# Patient Record
Sex: Female | Born: 1995 | Race: Black or African American | Hispanic: No | Marital: Single | Smoking: Never smoker
Health system: Southern US, Community
[De-identification: ages and names within clinical notes are randomized; demographics above are authoritative.]

## PROBLEM LIST (undated history)

## (undated) DIAGNOSIS — Z9109 Other allergy status, other than to drugs and biological substances: Secondary | ICD-10-CM

## (undated) DIAGNOSIS — Z8489 Family history of other specified conditions: Secondary | ICD-10-CM

## (undated) DIAGNOSIS — F329 Major depressive disorder, single episode, unspecified: Secondary | ICD-10-CM

## (undated) DIAGNOSIS — F419 Anxiety disorder, unspecified: Secondary | ICD-10-CM

## (undated) DIAGNOSIS — L709 Acne, unspecified: Secondary | ICD-10-CM

## (undated) DIAGNOSIS — M1990 Unspecified osteoarthritis, unspecified site: Secondary | ICD-10-CM

## (undated) DIAGNOSIS — T8859XA Other complications of anesthesia, initial encounter: Secondary | ICD-10-CM

## (undated) DIAGNOSIS — F32A Depression, unspecified: Secondary | ICD-10-CM

## (undated) DIAGNOSIS — Q501 Developmental ovarian cyst: Secondary | ICD-10-CM

## (undated) DIAGNOSIS — I10 Essential (primary) hypertension: Secondary | ICD-10-CM

## (undated) DIAGNOSIS — T4145XA Adverse effect of unspecified anesthetic, initial encounter: Secondary | ICD-10-CM

## (undated) HISTORY — DX: Anxiety disorder, unspecified: F41.9

## (undated) HISTORY — PX: NO PAST SURGERIES: SHX2092

## (undated) HISTORY — DX: Depression, unspecified: F32.A

## (undated) HISTORY — PX: UMBILICAL HERNIA REPAIR: SHX196

## (undated) HISTORY — DX: Major depressive disorder, single episode, unspecified: F32.9

---
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
## 2014-08-18 ENCOUNTER — Inpatient Hospital Stay (HOSPITAL_COMMUNITY)
Admission: RE | Admit: 2014-08-18 | Discharge: 2014-08-23 | DRG: 885 | Disposition: A | Discharge status: Home / Self Care | Payer: Federal, State, Local not specified - PPO | Attending: Psychiatry | Admitting: Psychiatry

## 2014-08-18 ENCOUNTER — Encounter (HOSPITAL_COMMUNITY): Payer: Self-pay | Admitting: *Deleted

## 2014-08-18 DIAGNOSIS — D709 Neutropenia, unspecified: Secondary | ICD-10-CM | POA: Clinically undetermined

## 2014-08-18 DIAGNOSIS — F332 Major depressive disorder, recurrent severe without psychotic features: Secondary | ICD-10-CM | POA: Diagnosis present

## 2014-08-18 DIAGNOSIS — F329 Major depressive disorder, single episode, unspecified: Secondary | ICD-10-CM | POA: Diagnosis present

## 2014-08-18 DIAGNOSIS — R05 Cough: Secondary | ICD-10-CM | POA: Diagnosis not present

## 2014-08-18 DIAGNOSIS — F419 Anxiety disorder, unspecified: Secondary | ICD-10-CM | POA: Diagnosis present

## 2014-08-18 DIAGNOSIS — R059 Cough, unspecified: Secondary | ICD-10-CM

## 2014-08-18 DIAGNOSIS — D72819 Decreased white blood cell count, unspecified: Secondary | ICD-10-CM | POA: Clinically undetermined

## 2014-08-18 LAB — CBC
HEMATOCRIT: 39 % (ref 36.0–46.0)
Hemoglobin: 13.1 g/dL (ref 12.0–15.0)
MCH: 29.3 pg (ref 26.0–34.0)
MCHC: 33.6 g/dL (ref 30.0–36.0)
MCV: 87.2 fL (ref 78.0–100.0)
Platelets: 208 10*3/uL (ref 150–400)
RBC: 4.47 MIL/uL (ref 3.87–5.11)
RDW: 12 % (ref 11.5–15.5)
WBC: 1.9 10*3/uL — AB (ref 4.0–10.5)

## 2014-08-18 LAB — COMPREHENSIVE METABOLIC PANEL
ALK PHOS: 77 U/L (ref 38–126)
ALT: 12 U/L — ABNORMAL LOW (ref 14–54)
AST: 22 U/L (ref 15–41)
Albumin: 3.7 g/dL (ref 3.5–5.0)
Anion gap: 6 (ref 5–15)
BUN: 8 mg/dL (ref 6–20)
CALCIUM: 8.5 mg/dL — AB (ref 8.9–10.3)
CO2: 21 mmol/L — ABNORMAL LOW (ref 22–32)
Chloride: 109 mmol/L (ref 101–111)
Creatinine, Ser: 0.97 mg/dL (ref 0.44–1.00)
GFR calc Af Amer: >60 mL/min (ref >60)
GFR calc non Af Amer: >60 mL/min (ref >60)
Glucose, Bld: 99 mg/dL (ref 70–99)
POTASSIUM: 4.1 mmol/L (ref 3.5–5.1)
Sodium: 136 mmol/L (ref 135–145)
TOTAL PROTEIN: 6.7 g/dL (ref 6.5–8.1)
Total Bilirubin: 0.4 mg/dL (ref 0.3–1.2)

## 2014-08-18 LAB — URINALYSIS, ROUTINE W REFLEX MICROSCOPIC
Bilirubin Urine: NEGATIVE
Glucose, UA: NEGATIVE mg/dL
HGB URINE DIPSTICK: NEGATIVE
Ketones, ur: NEGATIVE mg/dL
Leukocytes, UA: NEGATIVE
NITRITE: NEGATIVE
PROTEIN: NEGATIVE mg/dL
SPECIFIC GRAVITY, URINE: 1.03 (ref 1.005–1.030)
UROBILINOGEN UA: 0.2 mg/dL (ref 0.0–1.0)
pH: 5.5 (ref 5.0–8.0)

## 2014-08-18 LAB — RAPID URINE DRUG SCREEN, HOSP PERFORMED
Amphetamines: NOT DETECTED
BARBITURATES: NOT DETECTED
Benzodiazepines: NOT DETECTED
COCAINE: NOT DETECTED
Opiates: NOT DETECTED
TETRAHYDROCANNABINOL: NOT DETECTED

## 2014-08-18 LAB — HEPATIC FUNCTION PANEL
ALK PHOS: 80 U/L (ref 38–126)
ALT: 12 U/L — AB (ref 14–54)
AST: 20 U/L (ref 15–41)
Albumin: 3.7 g/dL (ref 3.5–5.0)
BILIRUBIN TOTAL: 0.4 mg/dL (ref 0.3–1.2)
Bilirubin, Direct: <0.1 mg/dL — ABNORMAL LOW (ref 0.1–0.5)
TOTAL PROTEIN: 6.7 g/dL (ref 6.5–8.1)

## 2014-08-18 LAB — PREGNANCY, URINE: PREG TEST UR: NEGATIVE

## 2014-08-18 LAB — TSH: TSH: 0.131 u[IU]/mL — AB (ref 0.350–4.500)

## 2014-08-18 LAB — ETHANOL: Alcohol, Ethyl (B): <5 mg/dL (ref <5)

## 2014-08-18 MED ORDER — GUAIFENESIN ER 600 MG PO TB12: 600.0000 mg | ORAL_TABLET | Freq: Two times a day (BID) | ORAL | Status: DC | PRN

## 2014-08-18 MED ORDER — MAGNESIUM HYDROXIDE 400 MG/5ML PO SUSP: 30.0000 mL | Freq: Every day | ORAL | Status: DC | PRN

## 2014-08-18 MED ORDER — AZITHROMYCIN 250 MG PO TABS
250.0000 mg | ORAL_TABLET | Freq: Every day | ORAL | Status: AC
  Administered 2014-08-19 – 2014-08-22 (×4): 250 mg via ORAL
  Filled 2014-08-18 (×6): qty 1

## 2014-08-18 MED ORDER — ALUM & MAG HYDROXIDE-SIMETH 200-200-20 MG/5ML PO SUSP: 30.0000 mL | ORAL | Status: DC | PRN

## 2014-08-18 MED ORDER — ACETAMINOPHEN 325 MG PO TABS: 650.0000 mg | ORAL_TABLET | Freq: Four times a day (QID) | ORAL | Status: DC | PRN

## 2014-08-18 MED ORDER — ALBUTEROL SULFATE (2.5 MG/3ML) 0.083% IN NEBU
2.5000 mg | INHALATION_SOLUTION | RESPIRATORY_TRACT | Status: DC
  Filled 2014-08-18 (×5): qty 3

## 2014-08-18 MED ORDER — EPINEPHRINE 0.3 MG/0.3ML IJ SOAJ: 0.3000 mg | INTRAMUSCULAR | Status: DC | PRN

## 2014-08-18 MED ORDER — ALBUTEROL SULFATE HFA 108 (90 BASE) MCG/ACT IN AERS
2.0000 | INHALATION_SPRAY | RESPIRATORY_TRACT | Status: DC
  Administered 2014-08-18 – 2014-08-22 (×23): 2 via RESPIRATORY_TRACT
  Filled 2014-08-18 (×2): qty 6.7

## 2014-08-18 MED ORDER — TRAZODONE HCL 50 MG PO TABS
50.0000 mg | ORAL_TABLET | Freq: Every evening | ORAL | Status: DC | PRN
  Filled 2014-08-18: qty 4

## 2014-08-18 MED ORDER — AZITHROMYCIN 500 MG PO TABS
500.0000 mg | ORAL_TABLET | Freq: Every day | ORAL | Status: AC
  Administered 2014-08-18: 500 mg via ORAL
  Filled 2014-08-18: qty 2
  Filled 2014-08-18: qty 1

## 2014-08-18 MED ORDER — IBUPROFEN 600 MG PO TABS
600.0000 mg | ORAL_TABLET | Freq: Four times a day (QID) | ORAL | Status: DC | PRN
  Administered 2014-08-18 – 2014-08-19 (×2): 600 mg via ORAL
  Filled 2014-08-18 (×2): qty 1

## 2014-08-18 NOTE — Progress Notes (Signed)
Introduced self to pt. Asked to provide with urine sample and provided ibuprofen for fever. LCSW with pt., so will revisit.

## 2014-08-18 NOTE — Progress Notes (Signed)
Patient was seen by this NP.  Per nursing patient was afebrile 102.  She c/o muscle aches and dry cough.  Lung sounds were clear.  She states that she started to be symptomatic this past weekend.  Ibuprofen was ordered and recheck of temp 99.1 Also ordered Mucinex.   Will continue to monitor.

## 2014-08-18 NOTE — Tx Team (Signed)
Initial Interdisciplinary Treatment Plan   PATIENT STRESSORS: Educational concerns Financial difficulties Health problems   PATIENT STRENGTHS: Ability for insight Active sense of humor Average or above average intelligence Capable of independent living Communication skills Financial means   PROBLEM LIST: Problem List/Patient Goals Date to be addressed Date deferred Reason deferred Estimated date of resolution  Depression 08/18/14     Suicidal Ideation 08/18/14                                                DISCHARGE CRITERIA:  Ability to meet basic life and health needs Adequate post-discharge living arrangements Improved stabilization in mood, thinking, and/or behavior Medical problems require only outpatient monitoring  PRELIMINARY DISCHARGE PLAN: Participate in family therapy Placement in alternative living arrangements Return to previous living arrangement Return to previous work or school arrangements  PATIENT/FAMIILY INVOLVEMENT: This treatment plan has been presented to and reviewed with the patient, Kathleen [name], and/or family member.  The patient and family have been given the opportunity to ask questions and make suggestions.  Lauralyn Primes 08/18/2014, 3:20 PM
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
## 2014-08-18 NOTE — BH Assessment (Addendum)
Tele Assessment Note   Kathleen [name] is an 19 y.o. female. Pt arrived voluntarily with her college counselor Kathleen [name] to [facility]. Pt reports SI/HI with no plan. Pt denied AVH. Pt reports 1 previous SI attempt at the age of 19 y.o. According to the Pt, she thinks about harming her family but she does not have a plan to harm them. Pt states that she does not have a identified family member whom she wants to harm. Pt reports the following stressors family conflict, finances, lack of friends, no appetite, decreased sleep, and tearfulness. Pt states that she has been depressed for a year. According to the Pt, she does not know what triggered her depression. Pt states that she does not know the triggers to her SI. Pt states that she was previously seen by a therapist but she cannot recall the doctor nor the practice. Pt was prescribed Zoloft last year but she is currently off of her medication. Pt reports that depressive symptoms worsened when she did not have her Zoloft. Pt also reports side-effects from Zoloft. Medication prescribed by Dr. Nancy [name] psychiatrist at [facility]. Pt reports poor appetite and decreased.  Pt states that she does not have a support system and she could not contract for safety.  Writer consulted with Kathleen Grates, NP. Per Kathleen [name] Pt meets inpatient criteria and is appropriate for [facility]. Pt admitted to [facility].   Axis I: Major Depression, Recurrent severe Axis II: Deferred Axis III:  Past Medical History  Diagnosis Date  . Acne   . Environmental allergies    Axis IV: educational problems, problems related to social environment and problems with primary support group Axis V: 31-40 impairment in reality testing  Past Medical History:  Past Medical History  Diagnosis Date  . Acne   . Environmental allergies     No past surgical history on file.  Family History: No family history on file.  Social History:  reports that she has never smoked. She has never used  smokeless tobacco. She reports that she does not drink alcohol or use illicit drugs.  Additional Social History:  Alcohol / Drug Use Pain Medications: Pt denies Prescriptions: Pt denies Over the Counter: Pt denies History of alcohol / drug use?: No history of alcohol / drug abuse Longest period of sobriety (when/how long): NA  CIWA:   COWS:    PATIENT STRENGTHS: (choose at least two) Average or above average intelligence Communication skills  Allergies:  Allergies  Allergen Reactions  . Fish Allergy     hives  . Shellfish Allergy     Home Medications:  (Not in a hospital admission)  OB/GYN Status:  No LMP recorded.  General Assessment Data Location of Assessment: [facility] Is this a Tele or Face-to-Face Assessment?: Tele Assessment Is this an Initial Assessment or a Re-assessment for this encounter?: Initial Assessment Marital status: Single Maiden name: Peeks Is patient pregnant?: No Pregnancy Status: No Living Arrangements: Parent, Other relatives Can pt return to current living arrangement?: Yes Admission Status: Voluntary Is patient capable of signing voluntary admission?: Yes Referral Source: Self/Family/Friend Insurance type: [place] Living Arrangements: Parent, Other relatives Name of Psychiatrist: NA Name of Therapist: NA  Education Status Is patient currently in school?: Yes Current Grade: College Highest grade of school patient has completed: 12 Name of school: [employer] person: [place] to self with the past 6 months Suicidal Ideation: Yes-Currently Present Has patient been a  risk to self within the past 6 months prior to admission? : Yes Suicidal Intent: Yes-Currently Present Has patient had any suicidal intent within the past 6 months prior to admission? : Yes Is patient at risk for suicide?: Yes Suicidal Plan?: No Has patient had any suicidal plan within the past 6 months prior  to admission? : No Access to Means: No What has been your use of drugs/alcohol within the last 12 months?: NA Previous Attempts/Gestures: Yes How many times?: 1 Other Self Harm Risks: NA Triggers for Past Attempts: Family contact Intentional Self Injurious Behavior: None Family Suicide History: No Recent stressful life event(s): Conflict (Comment) (conflict with family) Persecutory voices/beliefs?: No Depression: Yes Depression Symptoms: Tearfulness, Isolating, Fatigue, Loss of interest in usual pleasures, Feeling worthless/self pity, Feeling angry/irritable Substance abuse history and/or treatment for substance abuse?: No Suicide prevention information given to non-admitted patients: Not applicable  Risk to Others within the past 6 months Homicidal Ideation: Yes-Currently Present Does patient have any lifetime risk of violence toward others beyond the six months prior to admission? : No Thoughts of Harm to Others: Yes-Currently Present Comment - Thoughts of Harm to Others: thoughts of harm to family Current Homicidal Intent: No Current Homicidal Plan: No Access to Homicidal Means: No Identified Victim: NA History of harm to others?: No Assessment of Violence: None Noted Violent Behavior Description: NA Does patient have access to weapons?: No Criminal Charges Pending?: No Does patient have a court date: No Is patient on probation?: No  Psychosis Hallucinations: None noted Delusions: None noted  Mental Status Report Appearance/Hygiene: Disheveled Eye Contact: Fair Motor Activity: Freedom of movement Speech: Logical/coherent Level of Consciousness: Alert Mood: Depressed, Sad Affect: Depressed, Sad Anxiety Level: Moderate Thought Processes: Coherent, Relevant Judgement: Unimpaired Orientation: Person, Place, Time, Situation, Appropriate for developmental age Obsessive Compulsive Thoughts/Behaviors: None  Cognitive Functioning Concentration: Normal Memory: Recent  Intact, Remote Intact IQ: Average Insight: Fair Impulse Control: Fair Appetite: Poor Weight Loss: 0 Weight Gain: 0 Sleep: Decreased Total Hours of Sleep: 3 Vegetative Symptoms: None  ADLScreening [facility] Assessment Services) Patient's cognitive ability adequate to safely complete daily activities?: Yes Patient able to express need for assistance with ADLs?: No Independently performs ADLs?: Yes (appropriate for developmental age)  Prior Inpatient Therapy Prior Inpatient Therapy: Yes Prior Therapy Dates: 2013 Prior Therapy Facilty/Provider(s): [facility] Reason for Treatment: Depression  Prior Outpatient Therapy Prior Outpatient Therapy: Yes Prior Therapy Dates: 2016 Prior Therapy Facilty/Provider(s): [facility] Reason for Treatment: Depression Does patient have an ACCT team?: No Does patient have Intensive In-House Services?  : No Does patient have [facility] services? : No Does patient have P4CC services?: No  ADL Screening (condition at time of admission) Patient's cognitive ability adequate to safely complete daily activities?: Yes Is the patient deaf or have difficulty hearing?: No Does the patient have difficulty seeing, even when wearing glasses/contacts?: No Does the patient have difficulty concentrating, remembering, or making decisions?: No Patient able to express need for assistance with ADLs?: No Does the patient have difficulty dressing or bathing?: No Independently performs ADLs?: Yes (appropriate for developmental age) Does the patient have difficulty walking or climbing stairs?: No Weakness of Legs: None Weakness of Arms/Hands: None       Abuse/Neglect Assessment (Assessment to be complete while patient is alone) Physical Abuse: Denies Verbal Abuse: Denies Sexual Abuse: Denies Exploitation of patient/patient's resources: Denies Self-Neglect: Denies Values / Beliefs Cultural Requests During Hospitalization: None Spiritual Requests During Hospitalization:  None   Advance Directives (For Healthcare) Does patient  have an advance directive?: No Would patient like information on creating an advanced directive?: No - patient declined information    Additional Information 1:1 In Past 12 Months?: No CIRT Risk: No Elopement Risk: No Does patient have medical clearance?: Yes     Disposition:  Disposition Initial Assessment Completed for this Encounter: Yes Disposition of Patient: Inpatient treatment program Type of inpatient treatment program: Adult  Harvy Riera D 08/18/2014 1:31 PM

## 2014-08-19 ENCOUNTER — Encounter (HOSPITAL_COMMUNITY): Payer: Self-pay | Admitting: Psychiatry

## 2014-08-19 ENCOUNTER — Inpatient Hospital Stay (HOSPITAL_COMMUNITY)
Admission: RE | Admit: 2014-08-19 | Discharge: 2014-08-19 | Disposition: A | Discharge status: Home / Self Care | Payer: Federal, State, Local not specified - PPO | Source: Home / Self Care | Attending: Internal Medicine | Admitting: Internal Medicine

## 2014-08-19 DIAGNOSIS — R05 Cough: Secondary | ICD-10-CM

## 2014-08-19 DIAGNOSIS — R059 Cough, unspecified: Secondary | ICD-10-CM | POA: Diagnosis present

## 2014-08-19 LAB — INFLUENZA PANEL BY PCR (TYPE A & B)
H1N1 flu by pcr: NOT DETECTED
Influenza A By PCR: NEGATIVE
Influenza B By PCR: NEGATIVE

## 2014-08-19 LAB — LIPID PANEL
CHOL/HDL RATIO: 2.2 ratio
CHOLESTEROL: 107 mg/dL (ref 0–200)
HDL: 49 mg/dL (ref >40)
LDL CALC: 45 mg/dL (ref 0–99)
Triglycerides: 65 mg/dL (ref <150)
VLDL: 13 mg/dL (ref 0–40)

## 2014-08-19 MED ORDER — SERTRALINE HCL 50 MG PO TABS
50.0000 mg | ORAL_TABLET | Freq: Every day | ORAL | Status: DC
  Administered 2014-08-19 – 2014-08-23 (×5): 50 mg via ORAL
  Filled 2014-08-19: qty 1
  Filled 2014-08-19: qty 4
  Filled 2014-08-19 (×6): qty 1

## 2014-08-19 NOTE — Progress Notes (Signed)
D:Pt returned from cafeteria early with reports of nausea. Pt reports that she had problems with nausea taking Zoloft in the past. A:Escorted pt to her room. Gave [place] for nausea and a emesis bag. R:Safety maintained on the unit.

## 2014-08-19 NOTE — H&P (Addendum)
Psychiatric Admission Assessment Adult  Patient Identification: Kathleen [name] MRN:  [id number] Date of Evaluation:  08/19/2014 Chief Complaint:  Depression Principal Diagnosis: <principal problem not specified> Diagnosis:   Patient Active Problem List   Diagnosis Date Noted  . MDD (major depressive disorder) [F32.2] 08/18/2014  . Depression, major, single episode, moderate [F32.1] 10/01/2011  . Anxiety disorder [F41.9] 09/30/2011  . Parent-child relational problem [Z62.820] 09/30/2011   History of Present Illness::  Patient is 19 years old. She is single, lives with parents and is enrolled in college. She reports depression, sadness. States she has been struggling with depression for several weeks to months, and tends to ruminate about worsening academic performance, grades, which in turn she also attributes to depression, stating that it is hard for her to feel interest or energy to study as much as she needs to. She states she has family stressors, and states that her mother in particular " does not believe me that I am not feeling well". Reports that parents expect her to excel/ do well. Of note, over recent days patient has developed some type of  Acute infection and describes dry cough, headache, myalgias ,  feeling weak, and fever.  States she did not want to go to college because she was feeling sick, but " mother took my temperature and I did not have a fever, so she told me I had to go". Patient went to a counselor on campus and reported some suicidal ideations, without specific plan, resulting in her being brought to hospital. As per chart notes, patient had reported some vague thoughts of harming her parents- at this time denies any violent or homicidal ideations.    Elements:   Worsening depression, in the context of academic and family stressors and prior history of depressive illness .  Associated Signs/Symptoms: Depression Symptoms:  Describes anhedonia, low energy level,  poor sleep, disrupted appetite, decreased sense of self esteem. (Hypo) Manic Symptoms: does not endorse  Anxiety Symptoms:  Describes some free floating anxiety, and also describes symptoms suggestive of social anxiety, phobia, such as increased anxiety in group situations or having to present in front of a group. Psychotic Symptoms:  Does not endorse nor does she present internally preoccupied  PTSD Symptoms: Does not endorse  Total Time spent with patient: 45 minutes   Psychiatric History- patient describes a history of depression and endorses a history of self cutting although not since age 17. One prior admission to adolescent unit in the past for depression and suicidal gesture.. Denies history of psychosis,mania, or PTSD. Was not recently taking any psychiatric medications. In the past was on Zoloft, but took it only for a month or two- does not remember having had any side effects./ As noted describes symptoms of Social Phobia, and describes self as shy and tending to be introverted.  Past Medical History: Acute infectious illness - see above . Past Medical History  Diagnosis Date  . Acne   . Environmental allergies    History reviewed. No pertinent past surgical history. Family History:  Patient lives with her parents and has 4 siblings- history of mental illness in extended family members but cannot specify. Father has history of Alcohol Abuse  Social History:  Single, lives with parents, enrolled in college, no SO at this time, no children, denies legal issues, identifies dropping grades as a major stressor. History  Alcohol Use No     History  Drug Use No    History   Social History  .  Marital Status: Single    Spouse Name: N/A  . Number of Children: N/A  . Years of Education: N/A   Occupational History  . Student     repeat 11th grade at [employer] Topics  . Smoking status: Never Smoker   . Smokeless tobacco: Never Used  . Alcohol Use:  No  . Drug Use: No  . Sexual Activity: No   Other Topics Concern  . None   Social History Narrative   Additional Social History:    Pain Medications: None Prescriptions: None Over the Counter: V/A History of alcohol / drug use?: No history of alcohol / drug abuse Longest period of sobriety (when/how long): N/A Negative Consequences of Use: Work / Youth worker Withdrawal Symptoms: Other (Comment) (N/A)   Musculoskeletal: Strength & Muscle Tone: within normal limits Gait & Station: normal Patient leans: N/A  Psychiatric Specialty Exam: Physical Exam  Review of Systems  Constitutional: Positive for fever, weight loss and malaise/fatigue.       But today not febrile   HENT: Positive for sore throat.   Eyes: Negative.   Respiratory: Positive for cough. Negative for hemoptysis, sputum production, shortness of breath and wheezing.   Cardiovascular: Negative.   Gastrointestinal: Negative.  Negative for vomiting and diarrhea.  Genitourinary: Negative.  Negative for dysuria, urgency and frequency.  Musculoskeletal: Positive for myalgias and back pain.  Skin: Negative.  Negative for rash.  Neurological: Positive for headaches. Negative for seizures.  Endo/Heme/Allergies: Negative.   Psychiatric/Behavioral: Positive for depression and suicidal ideas.    Blood pressure 105/81, pulse 118, temperature 98.9 F (37.2 C), temperature source Oral, resp. rate 18, height 5' 5.5" (1.664 m), weight 121 lb (54.885 kg), SpO2 97 %.Body mass index is 19.82 kg/(m^2).  General Appearance: Fairly Groomed  Engineer, water::  Good  Speech:  Normal Rate  Volume:  Decreased  Mood:  Depressed  Affect:  Constricted  Thought Process:  Goal Directed and Linear  Orientation:  Full (Time, Place, and Person)- no current confusion or delirium  Thought Content:  denies hallucinations and is not internally preoccupied, no delusions expressed   Suicidal Thoughts:  No- at this time denies any plan or intention of  hurting herself and contracts for safety on the unit   Homicidal Thoughts:  No  Memory:  recent and remote grossly intact   Judgement:  Fair  Insight:  Fair  Psychomotor Activity:  Decreased  Concentration:  Good  Recall:  Good  Fund of Knowledge:Good  Language: Good  Akathisia:  Negative  Handed:  Right  AIMS (if indicated):     Assets:  Communication Skills Desire for Improvement Resilience Social Support  ADL's:  Impaired  Cognition: WNL  Sleep:      Risk to Self: Suicidal Ideation: Yes-Currently Present Suicidal Intent: Yes-Currently Present Is patient at risk for suicide?: Yes Suicidal Plan?: No Access to Means: No What has been your use of drugs/alcohol within the last 12 months?: Patient denies How many times?: 1 Other Self Harm Risks: NA Triggers for Past Attempts: Family contact Intentional Self Injurious Behavior: None Risk to Others: Homicidal Ideation: Yes-Currently Present Thoughts of Harm to Others: Yes-Currently Present Comment - Thoughts of Harm to Others: thoughts of harm to family Current Homicidal Intent: No Current Homicidal Plan: No Access to Homicidal Means: No Identified Victim: NA History of harm to others?: No Assessment of Violence: None Noted Violent Behavior Description: NA Does patient have access to weapons?: No Criminal Charges Pending?:  No Does patient have a court date: No Prior Inpatient Therapy: Prior Inpatient Therapy: Yes Prior Therapy Dates: 2013 Prior Therapy Facilty/Provider(s): [facility] Reason for Treatment: Depression Prior Outpatient Therapy: Prior Outpatient Therapy: Yes Prior Therapy Dates: 2016 Prior Therapy Facilty/Provider(s): [facility] Reason for Treatment: Depression Does patient have an ACCT team?: No Does patient have Intensive In-House Services?  : No Does patient have [facility] services? : No Does patient have P4CC services?: No  Alcohol Screening: 1. How often do you have a drink containing alcohol?:  Never 9. Have you or someone else been injured as a result of your drinking?: No 10. Has a relative or friend or a doctor or another health worker been concerned about your drinking or suggested you cut down?: No Alcohol Use Disorder Identification Test Final Score (AUDIT): 0 Brief Intervention: AUDIT score less than 7 or less-screening does not suggest unhealthy drinking-brief intervention not indicated  Allergies:   Allergies  Allergen Reactions  . Shellfish Allergy Anaphylaxis and Hives  . Fish Allergy Hives   Lab Results:  Results for orders placed or performed during the hospital encounter of 08/18/14 (from the past 48 hour(s))  Urinalysis, Routine w reflex microscopic     Status: None   Collection Time: 08/18/14  3:21 PM  Result Value Ref Range   Color, Urine YELLOW YELLOW   APPearance CLEAR CLEAR   Specific Gravity, Urine 1.030 1.005 - 1.030   pH 5.5 5.0 - 8.0   Glucose, UA NEGATIVE NEGATIVE mg/dL   Hgb urine dipstick NEGATIVE NEGATIVE   Bilirubin Urine NEGATIVE NEGATIVE   Ketones, ur NEGATIVE NEGATIVE mg/dL   Protein, ur NEGATIVE NEGATIVE mg/dL   Urobilinogen, UA 0.2 0.0 - 1.0 mg/dL   Nitrite NEGATIVE NEGATIVE   Leukocytes, UA NEGATIVE NEGATIVE    Comment: MICROSCOPIC NOT DONE ON URINES WITH NEGATIVE PROTEIN, BLOOD, LEUKOCYTES, NITRITE, OR GLUCOSE <1000 mg/dL. Performed at [facility]   Pregnancy, urine     Status: None   Collection Time: 08/18/14  3:21 PM  Result Value Ref Range   Preg Test, Ur NEGATIVE NEGATIVE    Comment:        THE SENSITIVITY OF THIS METHODOLOGY IS >20 mIU/mL. Performed at [facility]   Urine rapid drug screen (hosp performed)     Status: None   Collection Time: 08/18/14  3:21 PM  Result Value Ref Range   Opiates NONE DETECTED NONE DETECTED   Cocaine NONE DETECTED NONE DETECTED   Benzodiazepines NONE DETECTED NONE DETECTED   Amphetamines NONE DETECTED NONE DETECTED   Tetrahydrocannabinol NONE DETECTED  NONE DETECTED   Barbiturates NONE DETECTED NONE DETECTED    Comment:        DRUG SCREEN FOR MEDICAL PURPOSES ONLY.  IF CONFIRMATION IS NEEDED FOR ANY PURPOSE, NOTIFY LAB WITHIN 5 DAYS.        LOWEST DETECTABLE LIMITS FOR URINE DRUG SCREEN Drug Class       Cutoff (ng/mL) Amphetamine      1000 Barbiturate      200 Benzodiazepine   027 Tricyclics       253 Opiates          300 Cocaine          300 THC              50 Performed at [facility]   Influenza panel by PCR (type A & B, H1N1)     Status: None   Collection Time: 08/18/14  6:52 PM  Result Value Ref Range   Influenza A By PCR NEGATIVE NEGATIVE   Influenza B By PCR NEGATIVE NEGATIVE   H1N1 flu by pcr NOT DETECTED NOT DETECTED    Comment:        The Xpert Flu assay (FDA approved for nasal aspirates or washes and nasopharyngeal swab specimens), is intended as an aid in the diagnosis of influenza and should not be used as a sole basis for treatment. Performed at [facility]   Hepatic function panel     Status: Abnormal   Collection Time: 08/18/14  7:30 PM  Result Value Ref Range   Total Protein 6.7 6.5 - 8.1 g/dL   Albumin 3.7 3.5 - 5.0 g/dL   AST 20 15 - 41 U/L   ALT 12 (L) 14 - 54 U/L   Alkaline Phosphatase 80 38 - 126 U/L   Total Bilirubin 0.4 0.3 - 1.2 mg/dL   Bilirubin, Direct <0.1 (L) 0.1 - 0.5 mg/dL   Indirect Bilirubin NOT CALCULATED 0.3 - 0.9 mg/dL    Comment: Performed at [facility]  CBC     Status: Abnormal   Collection Time: 08/18/14  7:33 PM  Result Value Ref Range   WBC 1.9 (L) 4.0 - 10.5 K/uL   RBC 4.47 3.87 - 5.11 MIL/uL   Hemoglobin 13.1 12.0 - 15.0 g/dL   HCT 39.0 36.0 - 46.0 %   MCV 87.2 78.0 - 100.0 fL   MCH 29.3 26.0 - 34.0 pg   MCHC 33.6 30.0 - 36.0 g/dL   RDW 12.0 11.5 - 15.5 %   Platelets 208 150 - 400 K/uL    Comment: Performed at [facility]  Comprehensive metabolic panel     Status: Abnormal   Collection Time:  08/18/14  7:33 PM  Result Value Ref Range   Sodium 136 135 - 145 mmol/L   Potassium 4.1 3.5 - 5.1 mmol/L   Chloride 109 101 - 111 mmol/L   CO2 21 (L) 22 - 32 mmol/L   Glucose, Bld 99 70 - 99 mg/dL   BUN 8 6 - 20 mg/dL   Creatinine, Ser 0.97 0.44 - 1.00 mg/dL   Calcium 8.5 (L) 8.9 - 10.3 mg/dL   Total Protein 6.7 6.5 - 8.1 g/dL   Albumin 3.7 3.5 - 5.0 g/dL   AST 22 15 - 41 U/L   ALT 12 (L) 14 - 54 U/L   Alkaline Phosphatase 77 38 - 126 U/L   Total Bilirubin 0.4 0.3 - 1.2 mg/dL   GFR calc non Af Amer >60 >60 mL/min   GFR calc Af Amer >60 >60 mL/min    Comment: (NOTE) The eGFR has been calculated using the CKD EPI equation. This calculation has not been validated in all clinical situations. eGFR's persistently <60 mL/min signify possible Chronic Kidney Disease.    Anion gap 6 5 - 15    Comment: Performed at [facility]  TSH     Status: Abnormal   Collection Time: 08/18/14  7:33 PM  Result Value Ref Range   TSH 0.131 (L) 0.350 - 4.500 uIU/mL    Comment: Performed at [facility]  Ethanol     Status: None   Collection Time: 08/18/14  7:33 PM  Result Value Ref Range   Alcohol, Ethyl (B) <5 <5 mg/dL    Comment:        LOWEST DETECTABLE LIMIT FOR SERUM ALCOHOL IS 11 mg/dL  FOR MEDICAL PURPOSES ONLY Performed at [facility]   Lipid panel     Status: None   Collection Time: 08/18/14  7:33 PM  Result Value Ref Range   Cholesterol 107 0 - 200 mg/dL   Triglycerides 65 <150 mg/dL   HDL 49 >40 mg/dL   Total CHOL/HDL Ratio 2.2 RATIO   VLDL 13 0 - 40 mg/dL   LDL Cholesterol 45 0 - 99 mg/dL    Comment:        Total Cholesterol/HDL:CHD Risk Coronary Heart Disease Risk Table                     Men   Women  1/2 Average Risk   3.4   3.3  Average Risk       5.0   4.4  2 X Average Risk   9.6   7.1  3 X Average Risk  23.4   11.0        Use the calculated Patient Ratio above and the CHD Risk Table to determine the patient's  CHD Risk.        ATP III CLASSIFICATION (LDL):  <100     mg/dL   Optimal  100-129  mg/dL   Near or Above                    Optimal  130-159  mg/dL   Borderline  160-189  mg/dL   High  >190     mg/dL   Very High Performed at [facility]    Current Medications: Current Facility-Administered Medications  Medication Dose Route Frequency Provider Last Rate Last Dose  . acetaminophen (TYLENOL) tablet 650 mg  650 mg Oral Q6H PRN [name] B Rankin, NP      . albuterol (PROVENTIL HFA;VENTOLIN HFA) 108 (90 BASE) MCG/ACT inhaler 2 puff  2 puff Inhalation Q4H Kerrie Buffalo, NP   2 puff at 08/19/14 1230  . alum & mag hydroxide-simeth (MAALOX/MYLANTA) 200-200-20 MG/5ML suspension 30 mL  30 mL Oral Q4H PRN [name] B Rankin, NP      . azithromycin (ZITHROMAX) tablet 250 mg  250 mg Oral Daily Kerrie Buffalo, NP   250 mg at 08/19/14 0820  . EPINEPHrine (EPI-PEN) injection 0.3 mg  0.3 mg Intramuscular PRN [name] B Rankin, NP      . guaiFENesin (MUCINEX) 12 hr tablet 600 mg  600 mg Oral BID PRN Kerrie Buffalo, NP      . ibuprofen (ADVIL,MOTRIN) tablet 600 mg  600 mg Oral Q6H PRN Kerrie Buffalo, NP   600 mg at 08/19/14 0827  . magnesium hydroxide (MILK OF MAGNESIA) suspension 30 mL  30 mL Oral Daily PRN [name] B Rankin, NP      . traZODone (DESYREL) tablet 50 mg  50 mg Oral QHS PRN [name] B Rankin, NP       PTA Medications: Prescriptions prior to admission  Medication Sig Dispense Refill Last Dose  . diphenhydrAMINE (BENADRYL) 12.5 MG/5ML liquid Take 50 mg by mouth every 6 (six) hours as needed for allergies.   Past Week  . EPINEPHrine 0.3 mg/0.3 mL IJ SOAJ injection Inject 0.3 mg into the muscle once as needed (For anaphylaxis.).   never used  . ibuprofen (ADVIL,MOTRIN) 200 MG tablet Take 200 mg by mouth every 6 (six) hours as needed for fever.   08/17/2014  . loratadine (CLARITIN) 10 MG tablet Take 10 mg by mouth daily as needed for allergies.   08/18/2014  Previous Psychotropic Medications:  As  above, states she was on Zoloft for a brief period of time ( 1-2 months ) in the past. Does not remember having had side effects. Not taking any psychiatric medications recently.  Substance Abuse History in the last 12 months:  No.- patient denies any alcohol or drug abuse     Consequences of Substance Abuse: Negative  Results for orders placed or performed during the hospital encounter of 08/18/14 (from the past 72 hour(s))  Urinalysis, Routine w reflex microscopic     Status: None   Collection Time: 08/18/14  3:21 PM  Result Value Ref Range   Color, Urine YELLOW YELLOW   APPearance CLEAR CLEAR   Specific Gravity, Urine 1.030 1.005 - 1.030   pH 5.5 5.0 - 8.0   Glucose, UA NEGATIVE NEGATIVE mg/dL   Hgb urine dipstick NEGATIVE NEGATIVE   Bilirubin Urine NEGATIVE NEGATIVE   Ketones, ur NEGATIVE NEGATIVE mg/dL   Protein, ur NEGATIVE NEGATIVE mg/dL   Urobilinogen, UA 0.2 0.0 - 1.0 mg/dL   Nitrite NEGATIVE NEGATIVE   Leukocytes, UA NEGATIVE NEGATIVE    Comment: MICROSCOPIC NOT DONE ON URINES WITH NEGATIVE PROTEIN, BLOOD, LEUKOCYTES, NITRITE, OR GLUCOSE <1000 mg/dL. Performed at [facility]   Pregnancy, urine     Status: None   Collection Time: 08/18/14  3:21 PM  Result Value Ref Range   Preg Test, Ur NEGATIVE NEGATIVE    Comment:        THE SENSITIVITY OF THIS METHODOLOGY IS >20 mIU/mL. Performed at [facility]   Urine rapid drug screen (hosp performed)     Status: None   Collection Time: 08/18/14  3:21 PM  Result Value Ref Range   Opiates NONE DETECTED NONE DETECTED   Cocaine NONE DETECTED NONE DETECTED   Benzodiazepines NONE DETECTED NONE DETECTED   Amphetamines NONE DETECTED NONE DETECTED   Tetrahydrocannabinol NONE DETECTED NONE DETECTED   Barbiturates NONE DETECTED NONE DETECTED    Comment:        DRUG SCREEN FOR MEDICAL PURPOSES ONLY.  IF CONFIRMATION IS NEEDED FOR ANY PURPOSE, NOTIFY LAB WITHIN 5 DAYS.        LOWEST  DETECTABLE LIMITS FOR URINE DRUG SCREEN Drug Class       Cutoff (ng/mL) Amphetamine      1000 Barbiturate      200 Benzodiazepine   287 Tricyclics       867 Opiates          300 Cocaine          300 THC              50 Performed at [facility]   Influenza panel by PCR (type A & B, H1N1)     Status: None   Collection Time: 08/18/14  6:52 PM  Result Value Ref Range   Influenza A By PCR NEGATIVE NEGATIVE   Influenza B By PCR NEGATIVE NEGATIVE   H1N1 flu by pcr NOT DETECTED NOT DETECTED    Comment:        The Xpert Flu assay (FDA approved for nasal aspirates or washes and nasopharyngeal swab specimens), is intended as an aid in the diagnosis of influenza and should not be used as a sole basis for treatment. Performed at [facility]   Hepatic function panel     Status: Abnormal   Collection Time: 08/18/14  7:30 PM  Result Value Ref Range   Total Protein 6.7 6.5 -  8.1 g/dL   Albumin 3.7 3.5 - 5.0 g/dL   AST 20 15 - 41 U/L   ALT 12 (L) 14 - 54 U/L   Alkaline Phosphatase 80 38 - 126 U/L   Total Bilirubin 0.4 0.3 - 1.2 mg/dL   Bilirubin, Direct <0.1 (L) 0.1 - 0.5 mg/dL   Indirect Bilirubin NOT CALCULATED 0.3 - 0.9 mg/dL    Comment: Performed at [facility]  CBC     Status: Abnormal   Collection Time: 08/18/14  7:33 PM  Result Value Ref Range   WBC 1.9 (L) 4.0 - 10.5 K/uL   RBC 4.47 3.87 - 5.11 MIL/uL   Hemoglobin 13.1 12.0 - 15.0 g/dL   HCT 39.0 36.0 - 46.0 %   MCV 87.2 78.0 - 100.0 fL   MCH 29.3 26.0 - 34.0 pg   MCHC 33.6 30.0 - 36.0 g/dL   RDW 12.0 11.5 - 15.5 %   Platelets 208 150 - 400 K/uL    Comment: Performed at [facility]  Comprehensive metabolic panel     Status: Abnormal   Collection Time: 08/18/14  7:33 PM  Result Value Ref Range   Sodium 136 135 - 145 mmol/L   Potassium 4.1 3.5 - 5.1 mmol/L   Chloride 109 101 - 111 mmol/L   CO2 21 (L) 22 - 32 mmol/L   Glucose, Bld 99 70 - 99 mg/dL   BUN 8  6 - 20 mg/dL   Creatinine, Ser 0.97 0.44 - 1.00 mg/dL   Calcium 8.5 (L) 8.9 - 10.3 mg/dL   Total Protein 6.7 6.5 - 8.1 g/dL   Albumin 3.7 3.5 - 5.0 g/dL   AST 22 15 - 41 U/L   ALT 12 (L) 14 - 54 U/L   Alkaline Phosphatase 77 38 - 126 U/L   Total Bilirubin 0.4 0.3 - 1.2 mg/dL   GFR calc non Af Amer >60 >60 mL/min   GFR calc Af Amer >60 >60 mL/min    Comment: (NOTE) The eGFR has been calculated using the CKD EPI equation. This calculation has not been validated in all clinical situations. eGFR's persistently <60 mL/min signify possible Chronic Kidney Disease.    Anion gap 6 5 - 15    Comment: Performed at [facility]  TSH     Status: Abnormal   Collection Time: 08/18/14  7:33 PM  Result Value Ref Range   TSH 0.131 (L) 0.350 - 4.500 uIU/mL    Comment: Performed at [facility]  Ethanol     Status: None   Collection Time: 08/18/14  7:33 PM  Result Value Ref Range   Alcohol, Ethyl (B) <5 <5 mg/dL    Comment:        LOWEST DETECTABLE LIMIT FOR SERUM ALCOHOL IS 11 mg/dL FOR MEDICAL PURPOSES ONLY Performed at [facility]   Lipid panel     Status: None   Collection Time: 08/18/14  7:33 PM  Result Value Ref Range   Cholesterol 107 0 - 200 mg/dL   Triglycerides 65 <150 mg/dL   HDL 49 >40 mg/dL   Total CHOL/HDL Ratio 2.2 RATIO   VLDL 13 0 - 40 mg/dL   LDL Cholesterol 45 0 - 99 mg/dL    Comment:        Total Cholesterol/HDL:CHD Risk Coronary Heart Disease Risk Table  Men   Women  1/2 Average Risk   3.4   3.3  Average Risk       5.0   4.4  2 X Average Risk   9.6   7.1  3 X Average Risk  23.4   11.0        Use the calculated Patient Ratio above and the CHD Risk Table to determine the patient's CHD Risk.        ATP III CLASSIFICATION (LDL):  <100     mg/dL   Optimal  100-129  mg/dL   Near or Above                    Optimal  130-159  mg/dL   Borderline  160-189  mg/dL   High  >190     mg/dL   Very  High Performed at [facility]     Observation Level/Precautions:  15 minute checks  Laboratory:  As needed - will request hospitalist consult regarding report of febrile illness and suppressed WBC/ TSH - of note , influenza work up negative . Repeat CBC and obtain T3/FT4  Psychotherapy:  Milieu, supportive  Groups   Medications:   Patient agrees to Zoloft trial- we reviewed side effects to include risk of increased suicidal ideations in young adults  Start Zoloft 50 mgrs QDAY   Consultations:  As above, will request hospitalist consult   Discharge Concerns:  Family stressors   Estimated LOS: 6 days   Other:     Psychological Evaluations: No   Treatment Plan Summary: Daily contact with patient to assess and evaluate symptoms and progress in treatment, Medication management, Plan inpatient psychiatric admission and medications as above   Medical Decision Making:  Review of Psycho-Social Stressors (1), Review or order clinical lab tests (1), Established Problem, Worsening (2) and Review of New Medication or Change in Dosage (2)  I certify that inpatient services furnished can reasonably be expected to improve the patient's condition.   Santhosh Gulino 5/6/20161:57 PM

## 2014-08-19 NOTE — [facility] Suicide Risk Assessment (Addendum)
[place] INPATIENT:  Family/Significant Other Suicide Prevention Education  Suicide Prevention Education:  Contact Attempts: Alcario Drought, Mother, [phone]; has been identified by the patient as the family member/significant other with whom the patient will be residing, and identified as the person(s) who will aid the patient in the event of a mental health crisis.  With written consent from the patient, two attempts were made to provide suicide prevention education, prior to and/or following the patient's discharge.  We were unsuccessful in providing suicide prevention education.  A suicide education pamphlet was given to the patient to share with family/significant other.   Date and time of first attempt: Message left on mother's voicemail to call CSW around 3:30 PM. Date and time of second attempt:  Message left on voicemail on Monday, May 9, ,2016 at 11:10 AM  Concha Pyo 08/19/2014, 3:30 PM

## 2014-08-19 NOTE — Plan of Care (Signed)
Problem: Alteration in mood Goal: LTG-Patient reports reduction in suicidal thoughts (Patient reports reduction in suicidal thoughts and is able to verbalize a safety plan for whenever patient is feeling suicidal)  Outcome: Progressing Pt reports decrease in SI thoughts. Goal: STG-Patient reports thoughts of self-harm to staff Outcome: Progressing Pt denies self harm thoughts today.

## 2014-08-19 NOTE — Progress Notes (Signed)
D:Pt is in her bed with a headache and productive cough. Pt rates her headache pain as a 5 on 1-10 scale with 10 being the most. Pt rates depression and anxiety as a 5 on 1-10 scale with 10 being the most. A:Offered support and 15 minute checks. Gave prn ibuprofen for headache. Gave 2 cups of water. Called [facility] lab requesting lab results for the flu that was collected yesterday evening and was told it was being sent to [facility] and may have a result between 10am and 11am. Was told could call [phone] at that time. R:Pt denies si and hi. Safety maintained on the unit.

## 2014-08-19 NOTE — Consult Note (Signed)
[facility] Medical Consultation  Kathleen [name] YQI:[id number] DOB: 22-Jul-1995 DOA: 08/18/2014 PCP: No primary care provider on file.   Requesting physician: Dr. Parke Poisson  Date of consultation: 08/19/2014 Reason for consultation: fevers and malaise   Impression/Recommendations Principal Problem:   MDD (major depressive disorder) - management per primary team    Fever and leukopenia  - unclear etiology  - with leukopenia, certainly worrisome for underlying immunocompromised condition - will for CXR, HIV - repeat CBC with diff in AM - UA with no signs of UTI but was obtained on 5/5, will repeat today  - pt was started on zithromax for presumptive bronchitis, today is day #2 - continue tylenol as needed for fever    Elevated TSH - T3/T4 requested for further evaluation  I will followup again tomorrow. Please contact me if I can be of assistance in the meanwhile. Thank you for this consultation.  Chief Complaint: fever and myalgias   HPI:  Pt is very pleasant 19 yo female with known depression, admitted voluntary on 5/5 for management of depression, now with persistent fevers of up to 102.5 F. Pt reports non productive cough for several days and chest discomfort with coughing spells, this has been associated with malaise and poor oral intake. Pt denies known sick contacts or exposures, no similar events in the past. Pt also denies abd or urinary concerns, no weight loss..  Review of Systems:  Constitutional: Per HPI HENT: Negative for ear pain, nosebleeds, congestion, Eyes: Negative for pain, discharge, redness, itching and visual disturbance.  Respiratory: Negative for shortness of breath, wheezing and stridor.   Cardiovascular: Negative for chest pain, palpitations and leg swelling.  Gastrointestinal: Negative for abdominal distention.  Genitourinary: Negative for dysuria, urgency, frequency Musculoskeletal: Negative for back pain, joint swelling, arthralgias and gait  problem.  Neurological: Negative for dizziness, tremors, seizures, syncope Hematological: Negative for adenopathy. Does not bruise/bleed easily.  Psychiatric/Behavioral: Negative for hallucinations    Past Medical History  Diagnosis Date  . Acne   . Environmental allergies    History reviewed. No pertinent past surgical history. Social History:  reports that she has never smoked. She has never used smokeless tobacco. She reports that she does not drink alcohol or use illicit drugs.  Allergies  Allergen Reactions  . Shellfish Allergy Anaphylaxis and Hives  . Fish Allergy Hives   Family history of HTN   Prior to Admission medications   Medication Sig Start Date End Date Taking? Authorizing Provider  diphenhydrAMINE (BENADRYL) 12.5 MG/5ML liquid Take 50 mg by mouth every 6 (six) hours as needed for allergies.   Yes Historical Provider, MD  EPINEPHrine 0.3 mg/0.3 mL IJ SOAJ injection Inject 0.3 mg into the muscle once as needed (For anaphylaxis.).   Yes Historical Provider, MD  ibuprofen (ADVIL,MOTRIN) 200 MG tablet Take 200 mg by mouth every 6 (six) hours as needed for fever.   Yes Historical Provider, MD  loratadine (CLARITIN) 10 MG tablet Take 10 mg by mouth daily as needed for allergies.   Yes Historical Provider, MD   Physical Exam: Blood pressure 105/81, pulse 118, temperature 98.5 F (36.9 C), temperature source Oral, resp. rate 18, height 5' 5.5" (1.664 m), weight 54.885 kg (121 lb), SpO2 97 %. Filed Vitals:   08/19/14 1709  BP:   Pulse:   Temp: 98.5 F (36.9 C)  Resp:    Physical Exam  Constitutional: Appears well-developed and well-nourished. No distress.  HENT: Normocephalic. External right and left ear normal. Oropharynx is clear  and moist.  Eyes: Conjunctivae and EOM are normal. PERRLA, no scleral icterus.  Neck: Normal ROM. Neck supple. No JVD. No tracheal deviation. No thyromegaly.  CVS: RRR, S1/S2 +, no murmurs, no gallops, no carotid bruit.  Pulmonary:  Effort and breath sounds normal, no stridor, rhonchi, wheezes, rales.  Abdominal: Soft. BS +,  no distension, tenderness, rebound or guarding.  Musculoskeletal: Normal range of motion. No edema and no tenderness.  Lymphadenopathy: No lymphadenopathy noted, cervical, inguinal. Neuro: Alert. Normal reflexes, muscle tone coordination. No cranial nerve deficit. Skin: Skin is warm and dry. No rash noted. Not diaphoretic. No erythema. No pallor.  Psychiatric: Normal mood and affect. Behavior, judgment, thought content normal.   Labs on Admission:  Basic Metabolic Panel:  Recent Labs Lab 08/18/14 1933  NA 136  K 4.1  CL 109  CO2 21*  GLUCOSE 99  BUN 8  CREATININE 0.97  CALCIUM 8.5*   Liver Function Tests:  Recent Labs Lab 08/18/14 1930 08/18/14 1933  AST 20 22  ALT 12* 12*  ALKPHOS 80 77  BILITOT 0.4 0.4  PROT 6.7 6.7  ALBUMIN 3.7 3.7   CBC:  Recent Labs Lab 08/18/14 1933  WBC 1.9*  HGB 13.1  HCT 39.0  MCV 87.2  PLT 208   Radiological Exams on Admission: Pending    EKG: not done  Time spent: 50 minutes   MAGICK-Kaleisha Bhargava [facility] Pager [phone]  If 7PM-7AM, please contact night-coverage [email or web address] Password [facility] 08/19/2014, 5:43 PM

## 2014-08-19 NOTE — Progress Notes (Signed)
Patient ID: Kathleen [name], female   DOB: 06/26/1995, 19 y.o.   MRN: [id number] D: Client visible on the unit, visiting with parents "they talked to the school counselor and I will be able to catch up on my work" Client reports visit with parents was good, "but now I want to go home" Client also in dayroom doing a puzzle. Client reports depression as "3" of 10." "I didn't like being shut up in that room" A: Writer provided emotional support, encouraged client to report any concerns. Staff will monitor q28min for safety. R: Client is safe on the unit, attended group.

## 2014-08-19 NOTE — BHH Group Notes (Signed)
[place] LCSW Group Therapy  Feelings Around Relapse 1:15 -2:30        08/19/2014   Type of Therapy:  Group Therapy  Participation Level:  Appropriate  Participation Quality:  Appropriate  Affect:  Appropriate  Cognitive:  Attentive Appropriate  Insight:  Developing/Improving  Engagement in Therapy: Developing/Improving  Modes of Intervention:  Discussion Exploration Problem-Solving Supportive  Summary of Progress/Problems:  The topic for today was feelings around relapse. Patient processed feelings toward relapse and was able to relate to peers. Patient sharing relapsoing for her would be keeping things to herself and not asking for help.  Patient identified coping skills that can be used to prevent a relapse is talking with a therapist..   Concha Pyo 08/19/2014 3:26 PM

## 2014-08-19 NOTE — Progress Notes (Signed)
Patient ID: Kathleen [name], female   DOB: 1995/12/15, 19 y.o.   MRN: [id number] D: Client in bed, reports cold,shivering noted. A: Writer introduced self to client, adjusted temperature for comfort, VS taken. Medication reviewed and administered, education client on use of inhaler. Staff will monitor for safety q42min. R: Client is safe on the unit, reported warm with temperature adjustment.

## 2014-08-19 NOTE — BHH Group Notes (Signed)
[place] Group Notes:  (Nursing/MHT/Case Management/Adjunct)  Date:  08/18/14  Time:  2030  Type of Therapy:  Psychoeducational Skills  Participation Level:  Did not attend  Participation Quality:     Affect:    Cognitive:    Insight:    Engagement in Group:    Modes of Intervention:    Summary of Progress/Problems: Patient on droplet precautions and can not attend  Franciso Bend 08/19/2014, 12:08 AM

## 2014-08-19 NOTE — Progress Notes (Signed)
Pt stated that she had an ok day and is looking forward to feeling better.

## 2014-08-19 NOTE — BHH Group Notes (Signed)
[facility] LCSW Aftercare Discharge Planning Group Note   08/19/2014 11:37 AM  Participation Quality:  Patient did not attended group - isolated with flu.   Concha Pyo  08/19/2014  11:37 AM

## 2014-08-19 NOTE — Tx Team (Signed)
Interdisciplinary Treatment Plan Update (Adult)  Date:  08/19/2014  Time Reviewed:  11:39 AM   Progress in Treatment: Attending groups: Patient is new to the mileu. Participating in groups:  Patient is new to the mileu Taking medication as prescribed:  Patient is taking medications Tolerating medication:  Patient is tolerating medications Family/Significant othe contact made:   No, will asked for consent to make collateral contact Patient understands diagnosis:Yes, patient understands diagnosis and need for treatment Discussing patient identified problems/goals with staff:  Yes, patient is able to express goals/problems Medical problems stabilized or resolved:  Yes Denies suicidal/homicidal ideation: Yes, patient is denying SI/HI. Issues/concerns per patient self-inventory:   Other:   Discharge Plan or Barriers:  Patient will discharge home with outpatient follow up to be determined  Reason for Continuation of Hospitalization: Anxiety Depression Medication stabilization   Comments:    Kathleen [name] is an 19 y.o. female. Pt arrived voluntarily with her college counselor [name] to [facility]. Pt reports SI/HI with no plan. Pt denied AVH. Pt reports 1 previous SI attempt at the age of 19 y.o. According to the Pt, she thinks about harming her family but she does not have a plan to harm them. Pt states that she does not have a identified family member whom she wants to harm. Pt reports the following stressors family conflict, finances, lack of friends, no appetite, decreased sleep, and tearfulness. Pt states that she has been depressed for a year. According to the Pt, she does not know what triggered her depression. Pt states that she does not know the triggers to her SI. Pt states that she was previously seen by a therapist but she cannot recall the doctor nor the practice. Pt was prescribed Zoloft last year but she is currently off of her medication. Pt reports that depressive symptoms  worsened when she did not have her Zoloft. Pt also reports side-effects from Zoloft. Medication prescribed by Dr. [name] psychiatrist at [facility]. Pt reports poor appetite and decreased.    Additional comments:  Patient and CSW reviewed Patient Discharge Process Letter/Patient Involvement Form.  Patient verbalized understanding and signed form.  Patient and CSW also reviewed and identified patient's goals and treatment plan.  Patient verbalized understanding and agreed to plan.  Estimated length of stay: 3-5 days  New goal(s):  Stabilize flu symptoms.  Review of initial/current patient goals per problem list:  Patient depressive symptoms to be stabilized and outpatient follow up will be scheduled.   Attendees: Patient 08/19/2014 11:39 AM   Family:   08/19/2014 11:39 AM   Physician:  Neita Garnet, MD 08/19/2014 11:39 AM   Nursing:    08/19/2014 11:39 AM   Clinical Social Worker:  Joette Catching, LCSW 08/19/2014 11:39 AM   Clinical Social Worker:  Tilden Fossa, LCSW-A 08/19/2014 11:39 AM   Case Manager:  Lars Pinks, RN 08/19/2014 11:39 AM   Other:  Desma Paganini, RN 08/19/2014 11:39 AM  Other:  Keane Police, RN 08/19/2014  11:39 AM   Other:  08/19/2014 11:39 AM   Other:  08/19/2014 11:39 AM   Other:  08/19/2014 11:39 AM   Other:   08/19/2014 11:39 AM   Other:   08/19/2014 11:39 AM   Other:  08/19/2014 11:39 AM   Other:   08/19/2014 11:39 AM    Scribe for Treatment Team:   Concha Pyo, 08/19/2014   11:39 AM

## 2014-08-19 NOTE — BHH Suicide Risk Assessment (Signed)
[facility] Admission Suicide Risk Assessment   Nursing information obtained from:  Patient Demographic factors:  Low socioeconomic status, Living alone Current Mental Status:  Suicidal ideation indicated by patient Loss Factors:  Decrease in vocational status, Decline in physical health Historical Factors:  Prior suicide attempts Risk Reduction Factors:  Sense of responsibility to family Total Time spent with patient: 45 minutes Principal Problem: MDD (major depressive disorder) Diagnosis:   Patient Active Problem List   Diagnosis Date Noted  . MDD (major depressive disorder) [F32.2] 08/18/2014  . Depression, major, single episode, moderate [F32.1] 10/01/2011  . Anxiety disorder [F41.9] 09/30/2011  . Parent-child relational problem [Z62.820] 09/30/2011     Continued Clinical Symptoms:  Alcohol Use Disorder Identification Test Final Score (AUDIT): 0 The "Alcohol Use Disorders Identification Test", Guidelines for Use in Primary Care, Second Edition.  World Pharmacologist [facility] Score between 0-7:  no or low risk or alcohol related problems. Score between 8-15:  moderate risk of alcohol related problems. Score between 16-19:  high risk of alcohol related problems. Score 20 or above:  warrants further diagnostic evaluation for alcohol dependence and treatment.   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 Reports worsening depression over recent weeks to months, dropping grades/academic performance, increased isolation.  Reports some increased family stress, in particular regarding relationship with mother.Recently feeling ill, and did have fever, myalgias, dry cough upon admission ( influenza work up negative )  She  Reported depression and SI at college so was brought to ED. Has a history of having been on Zoloft briefly in the past, without side effects.     Musculoskeletal: Strength & Muscle Tone: within normal limits Gait & Station: normal Patient leans: N/A  Psychiatric Specialty Exam: Physical Exam  ROS  Blood pressure 105/81, pulse 118, temperature 98.9 F (37.2 C), temperature source Oral, resp. rate 18, height 5' 5.5" (1.664 m), weight 121 lb (54.885 kg), SpO2 97 %.Body mass index is 19.82 kg/(m^2).  SEE ADMIT NOTE MSE                                                        COGNITIVE FEATURES THAT CONTRIBUTE TO RISK:  Closed-mindedness    SUICIDE RISK:   Moderate:  Frequent suicidal ideation with limited intensity, and duration, some specificity in terms of plans, no associated intent, good self-control, limited dysphoria/symptomatology, some risk factors present, and identifiable protective factors, including available and accessible social support.  PLAN OF CARE: Patient will be admitted to inpatient psychiatric unit for stabilization and safety. Will provide and encourage milieu participation. Provide medication management and maked adjustments as needed.  Will follow daily.    Medical Decision Making:  Review of Psycho-Social Stressors (1), Review or order clinical lab tests (1), Established Problem, Worsening (2) and Review of Medication Regimen & Side Effects (2)  I certify that inpatient services furnished can reasonably be expected to improve the patient's condition.   Kathleen [name] 08/19/2014, 2:25 PM

## 2014-08-19 NOTE — Progress Notes (Signed)
Pt was taken to [facility] for chest x-ray accompanied by Probation officer via [place] [place].

## 2014-08-19 NOTE — Progress Notes (Signed)
Pt taken off of droplet precautions per Lattie Haw with Infection Control. Flu test negative. Instructed pt to wash hands frequently and when leaving and entering her room.

## 2014-08-20 DIAGNOSIS — F332 Major depressive disorder, recurrent severe without psychotic features: Secondary | ICD-10-CM

## 2014-08-20 LAB — CBC WITH DIFFERENTIAL/PLATELET
Basophils Absolute: 0 10*3/uL (ref 0.0–0.1)
Basophils Relative: 0 % (ref 0–1)
Eosinophils Absolute: 0 10*3/uL (ref 0.0–0.7)
Eosinophils Relative: 0 % (ref 0–5)
HCT: 41.5 % (ref 36.0–46.0)
Hemoglobin: 14.2 g/dL (ref 12.0–15.0)
LYMPHS PCT: 63 % — AB (ref 12–46)
Lymphs Abs: 1.5 10*3/uL (ref 0.7–4.0)
MCH: 29.4 pg (ref 26.0–34.0)
MCHC: 34.2 g/dL (ref 30.0–36.0)
MCV: 85.9 fL (ref 78.0–100.0)
MONOS PCT: 15 % — AB (ref 3–12)
Monocytes Absolute: 0.3 10*3/uL (ref 0.1–1.0)
NEUTROS ABS: 0.5 10*3/uL — AB (ref 1.7–7.7)
NEUTROS PCT: 22 % — AB (ref 43–77)
Platelets: 206 10*3/uL (ref 150–400)
RBC: 4.83 MIL/uL (ref 3.87–5.11)
RDW: 11.8 % (ref 11.5–15.5)
WBC: 2.3 10*3/uL — ABNORMAL LOW (ref 4.0–10.5)

## 2014-08-20 LAB — URINALYSIS, ROUTINE W REFLEX MICROSCOPIC
Bilirubin Urine: NEGATIVE
Glucose, UA: NEGATIVE mg/dL
KETONES UR: NEGATIVE mg/dL
Leukocytes, UA: NEGATIVE
Nitrite: NEGATIVE
PROTEIN: NEGATIVE mg/dL
Specific Gravity, Urine: 1.008 (ref 1.005–1.030)
Urobilinogen, UA: 0.2 mg/dL (ref 0.0–1.0)
pH: 5.5 (ref 5.0–8.0)

## 2014-08-20 LAB — T3, FREE: T3, Free: 2.5 pg/mL (ref 2.3–5.0)

## 2014-08-20 LAB — URINE MICROSCOPIC-ADD ON

## 2014-08-20 LAB — HEMOGLOBIN A1C
Hgb A1c MFr Bld: 5.3 % (ref 4.8–5.6)
MEAN PLASMA GLUCOSE: 105 mg/dL

## 2014-08-20 LAB — T4, FREE: Free T4: 0.79 ng/dL (ref 0.61–1.12)

## 2014-08-20 LAB — HIV ANTIBODY (ROUTINE TESTING W REFLEX): HIV SCREEN 4TH GENERATION: NONREACTIVE

## 2014-08-20 MED ORDER — ALUM & MAG HYDROXIDE-SIMETH 200-200-20 MG/5ML PO SUSP
30.0000 mL | Freq: Two times a day (BID) | ORAL | Status: AC
  Administered 2014-08-20: 30 mL via ORAL

## 2014-08-20 MED ORDER — METOPROLOL TARTRATE 25 MG PO TABS
25.0000 mg | ORAL_TABLET | Freq: Two times a day (BID) | ORAL | Status: DC
  Administered 2014-08-20 – 2014-08-23 (×6): 25 mg via ORAL
  Filled 2014-08-20: qty 8
  Filled 2014-08-20 (×9): qty 1
  Filled 2014-08-20: qty 8

## 2014-08-20 MED ORDER — ONDANSETRON HCL 4 MG PO TABS: 4.0000 mg | ORAL_TABLET | Freq: Three times a day (TID) | ORAL | Status: DC | PRN

## 2014-08-20 MED ORDER — ALUM & MAG HYDROXIDE-SIMETH 200-200-20 MG/5ML PO SUSP: 30.0000 mL | Freq: Two times a day (BID) | ORAL | Status: DC

## 2014-08-20 MED ORDER — PANTOPRAZOLE SODIUM 40 MG PO TBEC
40.0000 mg | DELAYED_RELEASE_TABLET | Freq: Every day | ORAL | Status: DC
  Administered 2014-08-20 – 2014-08-23 (×4): 40 mg via ORAL
  Filled 2014-08-20 (×6): qty 1

## 2014-08-20 NOTE — Progress Notes (Signed)
[facility] Group Notes:  (Nursing/MHT/Case Management/Adjunct)  Date:  08/20/2014  Time:  10:44 PM  Type of Therapy:  Group Therapy  Participation Level:  Active  Participation Quality:  Appropriate  Affect:  Appropriate  Cognitive:  Appropriate  Insight:  Appropriate  Engagement in Group:  Engaged  Modes of Intervention:  Socialization and Support  Summary of Progress/Problems: Pt. Was engaged and had good insight in group discussion.  Pt. Stated she would use healthy communication skills by remaining calm and convey what to say.  Lanell Persons 08/20/2014, 10:44 PM

## 2014-08-20 NOTE — BHH Group Notes (Signed)
[facility] Group Notes:  (Clinical Social Work)  08/20/2014     1:15-2:15PM  Summary of Progress/Problems:   The main focus of today's process group was to discuss unhealthy coping techniques currently being employed by patients, and reasons driving this.  Motivational Interviewing was utilized to help patients explore in depth the perceived benefits and costs of unhealthy coping techniques, especially those shared by most patients in the group such as isolation and people-pleasing.  Healthier coping skills were explored and much support provided between patients.   The patient was late to group, listened carefully however and nodded a good deal, seemed to agree with a lot that was being said.  Type of Therapy:  Group Therapy - Process   Participation Level:  Minimal  Participation Quality:  Attentive  Affect:  Blunted  Cognitive:  Appropriate  Insight:  Developing/Improving  Engagement in Therapy:  Developing/Improving  Modes of Intervention:  Education, Motivational Interviewing  Selmer Dominion, LCSW 08/20/2014, 4:49 PM

## 2014-08-20 NOTE — Plan of Care (Signed)
Problem: Consults Goal: Suicide Risk Patient Education (See Patient Education module for education specifics)  Outcome: Completed/Met Date Met:  08/20/14 Nurse discussed suicidal thoughts/coping skills with patient.

## 2014-08-20 NOTE — Progress Notes (Signed)
D:  Patient's self inventory sheet, patient has fair sleep, no sleep medication given.  Fair appetite, normal energy level, good concentration.  Rated depression 3, hopeless 2, anxiety 1.  Denied withdrawals.  Stated she has felt nauseated.  Denied SI, contracts for safety.  Physical pain, worst pain #4, back.  Pain medication has been helpful.  Goal is to get working on going home.  Plans to participate in groups.  Does have discharge plans.  No problems anticipated after discharge. A:  Medications administered per MD orders.  Emotional support and encouragement given patient. R:  Denied SI and HI, contracts for safety.  Denied A/V hallucinations.  Safety maintained with 15 minute checks. Patient seen by MD this morning.  Also blood work and UA sent to hospital lab per MD orders.  Patient drank several glasses of gatorade/ginger ale after blood work.  Has been resting in bed, stated she does feel better today.

## 2014-08-20 NOTE — BHH Group Notes (Signed)
The focus of this group is to educate the patient on the purpose and policies of crisis stabilization and provide a format to answer questions about their admission.  The group details unit policies and expectations of patients while admitted.  Patient did not attend 0900 nurse education orientation group this morning.  Patient in bed resting.

## 2014-08-20 NOTE — Progress Notes (Signed)
[facility] MD Progress Note  08/20/2014 5:11 PM Kathleen [name]  MRN:  [id number] Subjective:   Patient states that she is still feeling weak, tired, and dizzy when she walks. She attributes these symptoms to her being physically ill. She states her mood is depressed, but does admit she may be feeling  " a little better" today. She denies medication side effects at present .  Objective : I have discussed case with Nursing Staff and met with patient. Appreciate Hospitalist Consult. Patient no longer febrile and states she is starting to feel better, but as above , reports residual feelings of fatigue and dizziness . She is describing ongoing depression, but today seems somewhat improved, more conversant, with improved eye contact, and a slightly more reactive affect. She denies SI at present and also denies any psychotic symptoms. Group /milieu participation has been limited and has spent most time in her room. Denies medication side effects at present. Chest X Ray Negative , WBC still  Low  but has increased to 2.3  Blood Culture results pending.   Principal Problem: MDD (major depressive disorder) Diagnosis:   Patient Active Problem List   Diagnosis Date Noted  . Cough [R05]   . MDD (major depressive disorder) [F32.2] 08/18/2014  . Depression, major, single episode, moderate [F32.1] 10/01/2011  . Anxiety disorder [F41.9] 09/30/2011  . Parent-child relational problem [Z62.820] 09/30/2011   Total Time spent with patient: 20 minutes   Past Medical History:  Past Medical History  Diagnosis Date  . Acne   . Environmental allergies    History reviewed. No pertinent past surgical history. Family History: History reviewed. No pertinent family history. Social History:  History  Alcohol Use No     History  Drug Use No    History   Social History  . Marital Status: Single    Spouse Name: N/A  . Number of Children: N/A  . Years of Education: N/A   Occupational History  . Student      repeat 11th grade at [employer] Topics  . Smoking status: Never Smoker   . Smokeless tobacco: Never Used  . Alcohol Use: No  . Drug Use: No  . Sexual Activity: No   Other Topics Concern  . None   Social History Narrative   Additional History:    Sleep: Good  Appetite:  Fair   Assessment:   Musculoskeletal: Strength & Muscle Tone: within normal limits Gait & Station: normal Patient leans: N/A   Psychiatric Specialty Exam: Physical Exam  Review of Systems  Constitutional: Positive for weight loss and malaise/fatigue. Negative for fever.  HENT: Negative.   Eyes: Negative.   Respiratory: Positive for cough.        Dry cough, no SOB   Cardiovascular: Negative.   Gastrointestinal: Negative.  Negative for diarrhea.  Genitourinary: Negative.  Negative for dysuria, urgency and frequency.  Musculoskeletal: Negative.        Myalgias improved   Skin: Negative.   Neurological: Positive for weakness.  Psychiatric/Behavioral: Positive for depression.    Blood pressure 103/71, pulse 90, temperature 98.5 F (36.9 C), temperature source Oral, resp. rate 18, height 5' 5.5" (1.664 m), weight 121 lb (54.885 kg), SpO2 100 %.Body mass index is 19.82 kg/(m^2).  General Appearance: Fairly Groomed  Engineer, water::  improved  Speech:  Slow  Volume:  Decreased  Mood:  Depressed  Affect:  constricted but more reactive   Thought Process:  Linear  Orientation:  Full (Time, Place, and Person)  Thought Content:  denies hallucinations ,no delusions  Suicidal Thoughts:  No- at this time denies any suicidal plan or intention- contracts for safety on unit   Homicidal Thoughts:  No- specifically also  Denies any thoughts of hurting family/mother   Memory:  recent and remote grossly intact   Judgement:  Fair  Insight:  Fair  Psychomotor Activity:  Decreased  Concentration:  Good  Recall:  Good  Fund of Knowledge:Good  Language: Good  Akathisia:  Negative   Handed:  Right  AIMS (if indicated):     Assets:  Communication Skills Desire for Improvement Talents/Skills Vocational/Educational  ADL's:  Impaired  Cognition: WNL  Sleep:  Number of Hours: 6.25     Current Medications: Current Facility-Administered Medications  Medication Dose Route Frequency Provider Last Rate Last Dose  . acetaminophen (TYLENOL) tablet 650 mg  650 mg Oral Q6H PRN [name] B Rankin, NP      . albuterol (PROVENTIL HFA;VENTOLIN HFA) 108 (90 BASE) MCG/ACT inhaler 2 puff  2 puff Inhalation Q4H Kerrie Buffalo, NP   2 puff at 08/20/14 1300  . alum & mag hydroxide-simeth (MAALOX/MYLANTA) 200-200-20 MG/5ML suspension 30 mL  30 mL Oral BID Thurnell Lose, MD      . azithromycin [facility]) tablet 250 mg  250 mg Oral Daily Kerrie Buffalo, NP   250 mg at 08/20/14 0815  . EPINEPHrine (EPI-PEN) injection 0.3 mg  0.3 mg Intramuscular PRN [name] B Rankin, NP      . guaiFENesin (MUCINEX) 12 hr tablet 600 mg  600 mg Oral BID PRN Kerrie Buffalo, NP      . magnesium hydroxide (MILK OF MAGNESIA) suspension 30 mL  30 mL Oral Daily PRN [name] B Rankin, NP      . metoprolol tartrate (LOPRESSOR) tablet 25 mg  25 mg Oral BID Thurnell Lose, MD   25 mg at 08/20/14 1013  . ondansetron (ZOFRAN) tablet 4 mg  4 mg Oral Q8H PRN Thurnell Lose, MD      . pantoprazole (PROTONIX) EC tablet 40 mg  40 mg Oral Daily Thurnell Lose, MD   40 mg at 08/20/14 1013  . sertraline (ZOLOFT) tablet 50 mg  50 mg Oral Daily Jenne Campus, MD   50 mg at 08/20/14 0816  . traZODone (DESYREL) tablet 50 mg  50 mg Oral QHS PRN [name] B Rankin, NP        Lab Results:  Results for orders placed or performed during the hospital encounter of 08/18/14 (from the past 48 hour(s))  Influenza panel by PCR (type A & B, H1N1)     Status: None   Collection Time: 08/18/14  6:52 PM  Result Value Ref Range   Influenza A By PCR NEGATIVE NEGATIVE   Influenza B By PCR NEGATIVE NEGATIVE   H1N1 flu by pcr NOT DETECTED NOT  DETECTED    Comment:        The Xpert Flu assay (FDA approved for nasal aspirates or washes and nasopharyngeal swab specimens), is intended as an aid in the diagnosis of influenza and should not be used as a sole basis for treatment. Performed at [facility]   Hepatic function panel     Status: Abnormal   Collection Time: 08/18/14  7:30 PM  Result Value Ref Range   Total Protein 6.7 6.5 - 8.1 g/dL   Albumin 3.7 3.5 - 5.0 g/dL   AST 20 15 - 41 U/L  ALT 12 (L) 14 - 54 U/L   Alkaline Phosphatase 80 38 - 126 U/L   Total Bilirubin 0.4 0.3 - 1.2 mg/dL   Bilirubin, Direct <0.1 (L) 0.1 - 0.5 mg/dL   Indirect Bilirubin NOT CALCULATED 0.3 - 0.9 mg/dL    Comment: Performed at [facility]  CBC     Status: Abnormal   Collection Time: 08/18/14  7:33 PM  Result Value Ref Range   WBC 1.9 (L) 4.0 - 10.5 K/uL   RBC 4.47 3.87 - 5.11 MIL/uL   Hemoglobin 13.1 12.0 - 15.0 g/dL   HCT 39.0 36.0 - 46.0 %   MCV 87.2 78.0 - 100.0 fL   MCH 29.3 26.0 - 34.0 pg   MCHC 33.6 30.0 - 36.0 g/dL   RDW 12.0 11.5 - 15.5 %   Platelets 208 150 - 400 K/uL    Comment: Performed at [facility]  Comprehensive metabolic panel     Status: Abnormal   Collection Time: 08/18/14  7:33 PM  Result Value Ref Range   Sodium 136 135 - 145 mmol/L   Potassium 4.1 3.5 - 5.1 mmol/L   Chloride 109 101 - 111 mmol/L   CO2 21 (L) 22 - 32 mmol/L   Glucose, Bld 99 70 - 99 mg/dL   BUN 8 6 - 20 mg/dL   Creatinine, Ser 0.97 0.44 - 1.00 mg/dL   Calcium 8.5 (L) 8.9 - 10.3 mg/dL   Total Protein 6.7 6.5 - 8.1 g/dL   Albumin 3.7 3.5 - 5.0 g/dL   AST 22 15 - 41 U/L   ALT 12 (L) 14 - 54 U/L   Alkaline Phosphatase 77 38 - 126 U/L   Total Bilirubin 0.4 0.3 - 1.2 mg/dL   GFR calc non Af Amer >60 >60 mL/min   GFR calc Af Amer >60 >60 mL/min    Comment: (NOTE) The eGFR has been calculated using the CKD EPI equation. This calculation has not been validated in all clinical situations. eGFR's  persistently <60 mL/min signify possible Chronic Kidney Disease.    Anion gap 6 5 - 15    Comment: Performed at [facility]  Hemoglobin A1c     Status: None   Collection Time: 08/18/14  7:33 PM  Result Value Ref Range   Hgb A1c MFr Bld 5.3 4.8 - 5.6 %    Comment: (NOTE)         Pre-diabetes: 5.7 - 6.4         Diabetes: >6.4         Glycemic control for adults with diabetes: <7.0    Mean Plasma Glucose 105 mg/dL    Comment: (NOTE) Performed At: [facility] [address] [place], [place] [id number] Lindon Romp MD [id number] Performed at [facility]   TSH     Status: Abnormal   Collection Time: 08/18/14  7:33 PM  Result Value Ref Range   TSH 0.131 (L) 0.350 - 4.500 uIU/mL    Comment: Performed at [facility]  Ethanol     Status: None   Collection Time: 08/18/14  7:33 PM  Result Value Ref Range   Alcohol, Ethyl (B) <5 <5 mg/dL    Comment:        LOWEST DETECTABLE LIMIT FOR SERUM ALCOHOL IS 11 mg/dL FOR MEDICAL PURPOSES ONLY Performed at [facility]   Lipid panel     Status: None   Collection Time: 08/18/14  7:33 PM  Result Value Ref Range   Cholesterol 107 0 - 200 mg/dL   Triglycerides 65 <150 mg/dL   HDL 49 >40 mg/dL   Total CHOL/HDL Ratio 2.2 RATIO   VLDL 13 0 - 40 mg/dL   LDL Cholesterol 45 0 - 99 mg/dL    Comment:        Total Cholesterol/HDL:CHD Risk Coronary Heart Disease Risk Table                     Men   Women  1/2 Average Risk   3.4   3.3  Average Risk       5.0   4.4  2 X Average Risk   9.6   7.1  3 X Average Risk  23.4   11.0        Use the calculated Patient Ratio above and the CHD Risk Table to determine the patient's CHD Risk.        ATP III CLASSIFICATION (LDL):  <100     mg/dL   Optimal  100-129  mg/dL   Near or Above                    Optimal  130-159  mg/dL   Borderline  160-189  mg/dL   High  >190     mg/dL   Very High Performed at [facility]   HIV antibody     Status: None   Collection Time: 08/19/14  7:45 PM  Result Value Ref Range   HIV Screen 4th Generation wRfx Non Reactive Non Reactive    Comment: (NOTE) Performed At: [facility] [place], [place] [id number] Lindon Romp MD [id number] Performed at [facility]   T3, free     Status: None   Collection Time: 08/20/14  6:26 AM  Result Value Ref Range   T3, Free 2.5 2.3 - 5.0 pg/mL    Comment: (NOTE) Performed At: [facility] [place], [place] [id number] Lindon Romp MD [id number] Performed at [facility]   T4, free     Status: None   Collection Time: 08/20/14  6:26 AM  Result Value Ref Range   Free T4 0.79 0.61 - 1.12 ng/dL    Comment: Performed at [facility]  CBC with Differential/Platelet     Status: Abnormal   Collection Time: 08/20/14  6:26 AM  Result Value Ref Range   WBC 2.3 (L) 4.0 - 10.5 K/uL   RBC 4.83 3.87 - 5.11 MIL/uL   Hemoglobin 14.2 12.0 - 15.0 g/dL   HCT 41.5 36.0 - 46.0 %   MCV 85.9 78.0 - 100.0 fL   MCH 29.4 26.0 - 34.0 pg   MCHC 34.2 30.0 - 36.0 g/dL   RDW 11.8 11.5 - 15.5 %   Platelets 206 150 - 400 K/uL   Neutrophils Relative % 22 (L) 43 - 77 %   Lymphocytes Relative 63 (H) 12 - 46 %   Monocytes Relative 15 (H) 3 - 12 %   Eosinophils Relative 0 0 - 5 %   Basophils Relative 0 0 - 1 %   Neutro Abs 0.5 (L) 1.7 - 7.7 K/uL   Lymphs Abs 1.5 0.7 - 4.0 K/uL   Monocytes Absolute 0.3 0.1 - 1.0 K/uL   Eosinophils Absolute 0.0 0.0 - 0.7 K/uL   Basophils Absolute 0.0 0.0 - 0.1 K/uL   Smear Review MORPHOLOGY  UNREMARKABLE     Comment: Performed at [facility]  Urinalysis, Routine w reflex microscopic     Status: Abnormal   Collection Time: 08/20/14  9:06 AM  Result Value Ref Range   Color, Urine YELLOW YELLOW   APPearance CLOUDY (A) CLEAR   Specific Gravity, Urine 1.008 1.005 - 1.030   pH 5.5 5.0 - 8.0    Glucose, UA NEGATIVE NEGATIVE mg/dL   Hgb urine dipstick LARGE (A) NEGATIVE   Bilirubin Urine NEGATIVE NEGATIVE   Ketones, ur NEGATIVE NEGATIVE mg/dL   Protein, ur NEGATIVE NEGATIVE mg/dL   Urobilinogen, UA 0.2 0.0 - 1.0 mg/dL   Nitrite NEGATIVE NEGATIVE   Leukocytes, UA NEGATIVE NEGATIVE    Comment: Performed at [facility]  Urine microscopic-add on     Status: None   Collection Time: 08/20/14  9:06 AM  Result Value Ref Range   Squamous Epithelial / LPF RARE RARE   RBC / HPF TOO NUMEROUS TO COUNT <3 RBC/hpf   Bacteria, UA RARE RARE    Comment: Performed at [facility]    Physical Findings: AIMS: Facial and Oral Movements Muscles of Facial Expression: None, normal Lips and Perioral Area: None, normal Jaw: None, normal Tongue: None, normal,Extremity Movements Upper (arms, wrists, hands, fingers): None, normal Lower (legs, knees, ankles, toes): None, normal, Trunk Movements Neck, shoulders, hips: None, normal, Overall Severity Severity of abnormal movements (highest score from questions above): None, normal Incapacitation due to abnormal movements: None, normal Patient's awareness of abnormal movements (rate only patient's report): No Awareness, Dental Status Current problems with teeth and/or dentures?: No Does patient usually wear dentures?: No  CIWA:  CIWA-Ar Total: 1 COWS:  COWS Total Score: 3   Assessment- patient no longer febrile , but continues to report feeling weak/dizzy which she attributes to her acute medical illness ( viral infection?) rather than to depression. She is improving gradually, and of note her WBC have increased to 2.3. She has dry cough but CX Ray negative . She is tolerating medications well thus far- on zoloft  . Remains depressed, withdrawn, but denying any SI and at this time somewhat better engaged/related .  Treatment Plan Summary: Daily contact with patient to assess and evaluate symptoms and progress in  treatment, Medication management, Plan continue inpatient treatment and continue medications as below  Zoloft 50 mgrs QDAY for depression Appreciate Hospitalist involvement/follow up.    Medical Decision Making:  Established Problem, Stable/Improving (1), New problem, with additional work up planned, Review of Psycho-Social Stressors (1), Review or order clinical lab tests (1) and Review of Medication Regimen & Side Effects (2)     Kathleen [name] 08/20/2014, 5:11 PM

## 2014-08-20 NOTE — Progress Notes (Signed)
D: Pt has depressed affect and mood.  She reports her goal was "just participate in groups today."  Pt reports "I'm finally feeling better."  Pt denies SI/HI, denies hallucinations, denies pain.  Pt has been visible in milieu interacting with peers and staff appropriately.  Pt attended evening group.   A: Introduced self to pt.  Met with pt 1:1 and provided support and encouragement.  Medications administered per order.   R: Pt is compliant with medications.  Pt verbally contracts for safety and reports that she will notify staff of needs and concerns.  Will continue to monitor and assess.

## 2014-08-20 NOTE — Progress Notes (Addendum)
Consult Progress Note                                            Patient Demographics  Kathleen [name], is a 19 y.o. female, DOB - 1996-02-18, WUG:[id number]  Admit date - 08/18/2014   Admitting Physician Jenne Campus, MD  Outpatient Primary MD for the patient is No primary care provider on file.  LOS - 2   No chief complaint on file.       Subjective:   Kathleen [name] today has, No headache, No chest pain, No abdominal pain - mild Nausea, No new weakness tingling or numbness, No Cough - SOB.    Assessment & Plan    1. Depression and anxiety. Per primary team which is psychiatry.  2. Leukopenia. Likely due to NSAID use, NSAID stopped. Peripheral smear ordered. We will monitor.  3. Cough 5 days with low-grade fever on Thursday. Fever has resolved. Nontoxic appearance, complete 5 days of total azithromycin, chest x-ray clear, UA clear. Monitor. No further fevers. Blood cultures cancelled as now afebrile >48hrs.  4. Low TSH with mild tachycardia. Free T3 and T4 are pending, will add low-dose but a blocker which should help her tachycardia anxiety and hyperthyroidism if its proven to be. We will monitor numbers with you. She will definitely require one-time outpatient endocrine follow-up. Accommodation written and discharged AVSS section.   5. Mild nausea. Likely mild gastritis from NSAID use. Place on Maalox, Zofran and scheduled PPI once a day.     Medications  Scheduled Meds: . albuterol  2 puff Inhalation Q4H  . alum & mag hydroxide-simeth  30 mL Oral BID  . azithromycin  250 mg Oral Daily  . metoprolol tartrate  25 mg Oral BID  . sertraline  50 mg Oral Daily   Continuous Infusions:  PRN Meds:.acetaminophen,  EPINEPHrine, guaiFENesin, magnesium hydroxide, ondansetron, traZODone     Lab Results  Component Value Date   PLT 206 08/20/2014    Antibiotics    Anti-infectives    Start     Dose/Rate Route Frequency Ordered Stop   08/19/14 0800  azithromycin (ZITHROMAX) tablet 250 mg     250 mg Oral Daily 08/18/14 1714 08/23/14 0759   08/18/14 1800  azithromycin (ZITHROMAX) tablet 500 mg     500 mg Oral Daily 08/18/14 1714 08/18/14 1849          Objective:   Filed Vitals:   08/19/14 1230 08/19/14 1709 08/20/14 0600 08/20/14 0601  BP:   121/76 121/75  Pulse:   108 125  Temp: 98.9 F (37.2 C) 98.5 F (36.9 C) 98.6 F (37 C)   TempSrc: Oral Oral    Resp:   20   Height:  Weight:      SpO2:        Wt Readings from Last 3 Encounters:  08/18/14 54.885 kg (121 lb) (37 %*, Z = -0.34)  09/29/11 54.4 kg (119 lb 14.9 oz) (48 %*, Z = -0.04)   * Growth percentiles are based on CDC 2-20 Years data.    No intake or output data in the 24 hours ending 08/20/14 0912   Physical Exam  Awake Alert, Oriented X 3, No new F.N deficits, Anxious affect [place].AT,PERRAL Supple Neck,No JVD, No cervical lymphadenopathy appriciated.  Symmetrical Chest wall movement, Good air movement bilaterally, CTAB Rapid RRR,No Gallops,Rubs or new Murmurs, No Parasternal Heave +ve B.Sounds, Abd Soft, No tenderness, No organomegaly appriciated, No rebound - guarding or rigidity. No Cyanosis, Clubbing or edema, No new Rash or bruise     Data Review   Micro Results No results found for this or any previous visit (from the past 240 hour(s)).  Radiology Reports Dg Chest 2 View  08/19/2014   CLINICAL DATA:  Cough, shortness of Breath, fever  EXAM: CHEST  2 VIEW  COMPARISON:  None.  FINDINGS: Cardiomediastinal silhouette is unremarkable. No acute infiltrate or pleural effusion. No pulmonary edema. Bony thorax is unremarkable.  IMPRESSION: No active cardiopulmonary disease.   Electronically Signed   By: Lahoma Crocker  M.D.   On: 08/19/2014 17:03     CBC  Recent Labs Lab 08/18/14 1933 08/20/14 0626  WBC 1.9* 2.3*  HGB 13.1 14.2  HCT 39.0 41.5  PLT 208 206  MCV 87.2 85.9  MCH 29.3 29.4  MCHC 33.6 34.2  RDW 12.0 11.8  LYMPHSABS  --  PENDING  MONOABS  --  PENDING  EOSABS  --  PENDING  BASOSABS  --  PENDING    Chemistries   Recent Labs Lab 08/18/14 1930 08/18/14 1933  NA  --  136  K  --  4.1  CL  --  109  CO2  --  21*  GLUCOSE  --  99  BUN  --  8  CREATININE  --  0.97  CALCIUM  --  8.5*  AST 20 22  ALT 12* 12*  ALKPHOS 80 77  BILITOT 0.4 0.4   ------------------------------------------------------------------------------------------------------------------ estimated creatinine clearance is 80.8 mL/min (by C-G formula based on Cr of 0.97). ------------------------------------------------------------------------------------------------------------------  Recent Labs  08/18/14 1933  HGBA1C 5.3   ------------------------------------------------------------------------------------------------------------------  Recent Labs  08/18/14 1933  CHOL 107  HDL 49  LDLCALC 45  TRIG 65  CHOLHDL 2.2   ------------------------------------------------------------------------------------------------------------------  Recent Labs  08/18/14 1933  TSH 0.131*   ------------------------------------------------------------------------------------------------------------------ No results for input(s): VITAMINB12, FOLATE, FERRITIN, TIBC, IRON, RETICCTPCT in the last 72 hours.  Coagulation profile No results for input(s): INR, PROTIME in the last 168 hours.  No results for input(s): DDIMER in the last 72 hours.  Cardiac Enzymes No results for input(s): CKMB, TROPONINI, MYOGLOBIN in the last 168 hours.  Invalid input(s): CK ------------------------------------------------------------------------------------------------------------------ Invalid input(s): POCBNP     Time Spent  in minutes  35   Jaimie Pippins K M.D on 08/20/2014 at 9:12 AM  Between 7am to 7pm - Pager - [phone]  After 7pm go to [email or web address] - password [facility]  [facility]   Office  [phone]

## 2014-08-20 NOTE — Discharge Instructions (Signed)
Get CBC, BMP, TSH, free T3 and T4 checked by primary care physician in a week after discharge.

## 2014-08-21 LAB — CBC WITH DIFFERENTIAL/PLATELET
Basophils Absolute: 0 K/uL (ref 0.0–0.1)
Basophils Relative: 0 % (ref 0–1)
Eosinophils Absolute: 0 K/uL (ref 0.0–0.7)
Eosinophils Relative: 0 % (ref 0–5)
HCT: 41.3 % (ref 36.0–46.0)
Hemoglobin: 14 g/dL (ref 12.0–15.0)
Lymphocytes Relative: 75 % — ABNORMAL HIGH (ref 12–46)
Lymphs Abs: 2.1 K/uL (ref 0.7–4.0)
MCH: 29.2 pg (ref 26.0–34.0)
MCHC: 33.9 g/dL (ref 30.0–36.0)
MCV: 86 fL (ref 78.0–100.0)
Monocytes Absolute: 0.3 K/uL (ref 0.1–1.0)
Monocytes Relative: 11 % (ref 3–12)
Neutro Abs: 0.4 K/uL — ABNORMAL LOW (ref 1.7–7.7)
Neutrophils Relative %: 14 % — ABNORMAL LOW (ref 43–77)
Platelets: 234 K/uL (ref 150–400)
RBC: 4.8 MIL/uL (ref 3.87–5.11)
RDW: 12.1 % (ref 11.5–15.5)
WBC: 2.8 K/uL — ABNORMAL LOW (ref 4.0–10.5)

## 2014-08-21 LAB — TSH: TSH: 0.533 u[IU]/mL (ref 0.350–4.500)

## 2014-08-21 NOTE — Progress Notes (Signed)
D: Pt has depressed affect and mood.  She reports her goal tonight was "to get a good nights sleep."  Pt denies SI/HI, denies hallucinations, denies pain.  Pt has been visible in milieu interacting with peers and staff appropriately.  Pt attended evening group.   A: Met with pt 1:1 and provided support and encouragement.  Medications administered per order.  Notified pt that she has PRN medication ordered for sleep. R: Pt is compliant with medications.  Pt verbally contracts for safety.  Will continue to monitor and assess.

## 2014-08-21 NOTE — [facility] Group Notes (Signed)
The focus of this group is to educate the patient on the purpose and policies of crisis stabilization and provide a format to answer questions about their admission.  The group details unit policies and expectations of patients while admitted.  Patient attended 0900 nurse education orientation this morning.  Patient actively participated, appropriate affect, alert, appropriate insight and engagement.  Today patient will work on 3 goals for discharge.  

## 2014-08-21 NOTE — Plan of Care (Signed)
Problem: Diagnosis: Increased Risk For Suicide Attempt Goal: STG-Patient Will Attend All Groups On The Unit Outcome: Progressing Pt attended evening group on 08/20/14. Goal: STG-Patient Will Comply With Medication Regime Outcome: Progressing Pt has been compliant with scheduled medications this shift.

## 2014-08-21 NOTE — Plan of Care (Signed)
Problem: Consults Goal: Depression Patient Education See Patient Education Module for education specifics.  Outcome: Completed/Met Date Met:  08/21/14 Nurse discussed depression/coping skills with patient.

## 2014-08-21 NOTE — [facility] Group Notes (Signed)
[facility] Group Notes:  (Clinical Social Work)  08/21/2014  10:30-11:00AM  Summary of Progress/Problems:   The main focus of today's process group was to   1)  discuss the importance of adding supports  2)  define health supports versus unhealthy supports  3)  identify the patient's current unhealthy supports and plan how to handle them  4)  Identify the patient's current healthy supports and plan what to add.  An emphasis was placed on using counselor, doctor, therapy groups, 12-step groups, and problem-specific support groups to expand supports.    The patient expressed full comprehension of the concepts presented, and agreed that there is a need to add more supports.  The patient stated her family, her pets (dogs, crab, hermit, ferret), her therapist, and the peer group were healthy supports.   Type of Therapy:  Process Group with Motivational Interviewing  Participation Level:  Active  Participation Quality:  Appropriate  Affect:  Flat, Depressed  Cognitive:  Alert and Oriented  Insight:  Developing/Improving  Engagement in Therapy:  Engaged  Modes of Intervention:   Education, Psychiatric nurse, Activity  Konor Noren A. Aubrey, LCSW 08/21/2014

## 2014-08-21 NOTE — Progress Notes (Signed)
D:  Patient's self inventory sheet, patient sleeps good, no sleep medication.  Fair appetite, normal energy level, good concentration.  Rated depression and anxiety #1, denied hopeless.  Denied withdrawals.  Denied SI.  Denied physical problems.  Denied pain.   Goal is to speak up and participate in group a little more.  Plans to volunteer to speak in group.  Does have discharge plans.  No problems anticipated after discharge. A:  Medications administered but patient declined maalox this morning, stated she did not need this med.  Emotional support and encouragement given patient. R:  Denied SI and HI, contracts for safety.  Denied A/V hallucinations.  Safety maintained with 15 minute checks. Hospital MD visited patient this morning.  Recommendations made for MD to follow up.  Stated thyroid alittle elevated, patient not to take motrin or NSAID's. Patient stated she is feeling much better today.

## 2014-08-21 NOTE — Progress Notes (Addendum)
Consult Progress Note                                            Patient Demographics  Kathleen [name], is a 19 y.o. female, DOB - Aug 11, 1995, OZD:[id number]  Admit date - 08/18/2014   Admitting Physician Jenne Campus, MD  Outpatient Primary MD for the patient is No primary care provider on file.  LOS - 3   No chief complaint on file.      Hospitalist team will sign off please call with questions.    Subjective:   Kathleen [name] today has, No headache, No chest pain, No abdominal pain - mild Nausea, No new weakness tingling or numbness, No Cough - SOB.    Assessment & Plan    1. Depression and anxiety. Per primary team which is psychiatry.  2. Leukopenia. Likely due to NSAID use, NSAID stopped. Peripheral smear ordered and pending follow with PCP for peripheral smear results. Continue pia is serially improving after NSAID stoppage. Have counseled to continue abstaining from NSAID in the outpatient setting. Request to follow with PCP in one week for a repeat CBC and to follow with cultures of peripheral smear.   3. Cough 5 days with low-grade fever on Thursday. Fever has resolved. Nontoxic appearance, complete 5 days of total azithromycin, chest x-ray clear, UA clear. Monitor. No further fevers. Blood cultures cancelled as now afebrile >48hrs.   4. Low TSH with mild tachycardia.repeat TSH along with free T3 and T4 are stable. Low-dose beta blocker to continue which is helping with her anxiety as well, tachycardia has resolved. She will benefit from one-time outpatient endocrine follow-up. Accommodation written and discharged AVSS section.   5. Mild nausea. Likely mild gastritis from NSAID use. Placed on Maalox, Zofran and  scheduled PPI once a day. Nausea completely resolved.   Follow-up Information    Follow up with [facility] On 09/08/2014.   Why:  Thursday, Sep 08, 2014 at 9 AM   Contact information:   [address] [place], [place]   [address]  [phone]      Follow up with Dr. Adele Schilder - [facility] On 09/20/2014.   Why:  Tuesday, September 27, 2014 at 1:15 PM   Contact information:   [place], [place]   [address]  [phone]      Follow up with Renato Shin, MD. Schedule an appointment as soon as possible for a visit in 1 week.   Specialty:  Endocrinology   Why:  Repeat TSH, free T3 and T4   Contact information:   [address] [place] [address] [phone]       Follow up with [facility]    . Schedule an appointment as soon as possible for a visit in 1 week.   Why:  Repeat CBC and follow-up on peripheral smear results   Contact information:   [place] [address] [phone]        Medications  Scheduled Meds: . albuterol  2 puff Inhalation Q4H  . alum & mag hydroxide-simeth  30 mL Oral BID  . azithromycin  250 mg Oral Daily  . metoprolol tartrate  25 mg Oral BID  . pantoprazole  40 mg Oral Daily  . sertraline  50 mg Oral Daily   Continuous Infusions:  PRN Meds:.acetaminophen, EPINEPHrine, guaiFENesin, magnesium hydroxide, ondansetron, traZODone     Lab Results  Component Value Date   PLT 234 08/21/2014    Antibiotics    Anti-infectives    Start     Dose/Rate Route Frequency Ordered Stop   08/19/14 0800  azithromycin (ZITHROMAX) tablet 250 mg     250 mg Oral Daily 08/18/14 1714 08/23/14 0759   08/18/14 1800  azithromycin (ZITHROMAX) tablet 500 mg     500 mg Oral Daily 08/18/14 1714 08/18/14 1849          Objective:   Filed Vitals:   08/20/14 1700 08/21/14 0630 08/21/14 0631 08/21/14 0947  BP: 103/71 118/77 110/79  118/77  Pulse: 90 92 107 92  Temp: 98.5 F (36.9 C) 98.5 F (36.9 C)    TempSrc: Oral Oral    Resp: 18 16    Height:      Weight:      SpO2: 100%       Wt Readings from Last 3 Encounters:  08/18/14 54.885 kg (121 lb) (37 %*, Z = -0.34)  09/29/11 54.4 kg (119 lb 14.9 oz) (48 %*, Z = -0.04)   * Growth percentiles are based on CDC 2-20 Years data.    No intake or output data in the 24 hours ending 08/21/14 1027   Physical Exam  Awake Alert, Oriented X 3, No new F.N deficits, Anxious affect [place].AT,PERRAL Supple Neck,No JVD, No cervical lymphadenopathy appriciated.  Symmetrical Chest wall movement, Good air movement bilaterally, CTAB Rapid RRR,No Gallops,Rubs or new Murmurs, No Parasternal Heave +ve B.Sounds, Abd Soft, No tenderness, No organomegaly appriciated, No rebound - guarding or rigidity. No Cyanosis, Clubbing or edema, No new Rash or bruise     Data Review   Micro Results No results found for this or any previous visit (from the past 240 hour(s)).  Radiology Reports Dg Chest 2 View  08/19/2014   CLINICAL DATA:  Cough, shortness of Breath, fever  EXAM: CHEST  2 VIEW  COMPARISON:  None.  FINDINGS: Cardiomediastinal silhouette is unremarkable. No acute infiltrate or pleural effusion. No pulmonary edema. Bony thorax is unremarkable.  IMPRESSION: No active cardiopulmonary disease.   Electronically Signed   By: Lahoma Crocker M.D.   On: 08/19/2014 17:03     CBC  Recent Labs Lab 08/18/14 1933 08/20/14 0626 08/21/14 0625  WBC 1.9* 2.3* 2.8*  HGB 13.1 14.2 14.0  HCT 39.0 41.5 41.3  PLT 208 206 234  MCV 87.2 85.9 86.0  MCH 29.3 29.4 29.2  MCHC 33.6 34.2 33.9  RDW 12.0 11.8 12.1  LYMPHSABS  --  1.5 2.1  MONOABS  --  0.3 0.3  EOSABS  --  0.0 0.0  BASOSABS  --  0.0 0.0    Chemistries   Recent Labs Lab 08/18/14 1930 08/18/14 1933  NA  --  136  K  --  4.1  CL  --  109  CO2  --  21*  GLUCOSE  --  99  BUN  --  8  CREATININE  --  0.97  CALCIUM  --  8.5*  AST  20 22  ALT 12* 12*  ALKPHOS 80 77  BILITOT 0.4 0.4   ------------------------------------------------------------------------------------------------------------------ estimated creatinine clearance is 80.8 mL/min (by C-G formula based on Cr of 0.97). ------------------------------------------------------------------------------------------------------------------  Recent Labs  08/18/14 1933  HGBA1C 5.3   ------------------------------------------------------------------------------------------------------------------  Recent Labs  08/18/14 1933  CHOL 107  HDL 49  LDLCALC 45  TRIG 65  CHOLHDL 2.2   ------------------------------------------------------------------------------------------------------------------  Recent Labs  08/20/14 0626 08/21/14 0625  TSH  --  0.533  T3FREE 2.5  --    Lab Results  Component Value Date   TSH 0.533 08/21/2014    ------------------------------------------------------------------------------------------------------------------ No results for input(s): VITAMINB12, FOLATE, FERRITIN, TIBC, IRON, RETICCTPCT in the last 72 hours.  Coagulation profile No results for input(s): INR, PROTIME in the last 168 hours.  No results for input(s): DDIMER in the last 72 hours.  Cardiac Enzymes No results for input(s): CKMB, TROPONINI, MYOGLOBIN in the last 168 hours.  Invalid input(s): CK ------------------------------------------------------------------------------------------------------------------ Invalid input(s): POCBNP     Time Spent in minutes  35   Nilaya Bouie K M.D on 08/21/2014 at 10:27 AM  Between 7am to 7pm - Pager - [phone]  After 7pm go to [email or web address] - password [facility]  [facility]   Office  [phone]

## 2014-08-22 DIAGNOSIS — D709 Neutropenia, unspecified: Secondary | ICD-10-CM | POA: Clinically undetermined

## 2014-08-22 DIAGNOSIS — D72819 Decreased white blood cell count, unspecified: Secondary | ICD-10-CM | POA: Clinically undetermined

## 2014-08-22 LAB — PATHOLOGIST SMEAR REVIEW

## 2014-08-22 MED ORDER — ALBUTEROL SULFATE HFA 108 (90 BASE) MCG/ACT IN AERS: 2.0000 | INHALATION_SPRAY | Freq: Four times a day (QID) | RESPIRATORY_TRACT | Status: DC | PRN

## 2014-08-22 NOTE — BHH Group Notes (Signed)
[facility] LCSW Group Therapy          Overcoming Obstacles       1:15 -2:30        08/22/2014       Type of Therapy:  Group Therapy  Participation Level:  Appropriate  Participation Quality:  Appropriate  Affect:  Appropriate, Alert  Cognitive:  Attentive Appropriate  Insight: Developing/Improving Engaged  Engagement in Therapy: Developing/Imprvoing Engaged  Modes of Intervention:  Discussion Exploration  Education Rapport BuildingProblem-Solving Support  Summary of Progress/Problems:  The main focus of today's group was overcoming obstacles.   Patient advised the obstacle she has to overcome is negative thinking.  She talked about how she tells herself she is unable to do things and then gives up on trying. Patient able to identify appropriate coping skills including changing negative thoughts to positive ones.   Concha Pyo 08/22/2014

## 2014-08-22 NOTE — BHH Group Notes (Signed)
[facility] LCSW Aftercare Discharge Planning Group Note   08/22/2014 10:08 AM    Participation Quality:  Appropraite  Mood/Affect:  Appropriate  Depression Rating:  1  Anxiety Rating:  1  Thoughts of Suicide:  No  Will you contract for safety?   NA  Current AVH:  No  Plan for Discharge/Comments:  Patient attended discharge planning group and actively participated in group. She reports feeling much better and looks forward to discharging tomorrow.  She will follow up with [facility]. Suicide prevention education reviewed and SPE document provided.   Transportation Means: Patient has transportation.   Supports:  Patient has a support system.   Ivy Puryear, Eulas Post

## 2014-08-22 NOTE — Progress Notes (Signed)
D:  Patient's self inventory sheet, patient has fair sleep, no sleep medication given.  Good appetite, normal energy level, good concentration.  Rated depression and anxiety #1, denied hopeless.  Denied withdrawals.  Denied SI.  Denied physical problems.  Denied pain.  Goal is to work on discharge plan.  Plans to speak with nurse/MD.  Does have discharge plans.  No problems anticipated after discharge. A:  Medications administered per MD orders.  Emotional support and encouragement given patient. R:  Denied SI and HI, contracts for safety.  Denied A/V hallucinations.  Safety maintained with 15 minute checks.

## 2014-08-22 NOTE — Plan of Care (Signed)
Problem: Ineffective individual coping Goal: STG: Patient will remain free from self harm Outcome: Progressing Pt denied thoughts of self-harm/SI tonight.  She verbally contracted for safety.

## 2014-08-22 NOTE — Progress Notes (Signed)
[facility] MD Progress Note  08/22/2014 3:33 PM Kathleen [name]  MRN:  [id number] Subjective:   Patient states " I am better . I had a fever, don't know how that happened , but I am OK now.I want to continue my treatment , my family is not very supportive .'    Objective : I have discussed case with Nursing Staff and met with patient. I have reviewed notes in EHR per Dr.Cobos as well as Hospitalist service. Patient with improvement of her affective sx, continues to be slow , likely psychomotor retardation 2/2 depression , but per review of notes in EHR , overall good improvement since admission.  Pt also with cough , neutropenia on presentation , was seen by Hospitalist. Pt to follow up with outpt PMD as well as endocrinology . Pt denies SI/HI. Pt denies any ADRs of medications . Pt denied new concerns . CSW to work on referral to IOP/PHP. Possible DC tomorrow.     Principal Problem: Major depressive disorder, recurrent, severe without psychotic features Diagnosis:   Patient Active Problem List   Diagnosis Date Noted  . Leukopenia [D72.819] 08/22/2014  . Neutropenia [D70.9] 08/22/2014  . Major depressive disorder, recurrent, severe without psychotic features [F33.2]   . Cough [R05]   . Parent-child relational problem [Z62.820] 09/30/2011   Total Time spent with patient: 30 minutes   Past Medical History:  Past Medical History  Diagnosis Date  . Acne   . Environmental allergies    History reviewed. No pertinent past surgical history. Family History: History reviewed. No pertinent family history. Social History:  History  Alcohol Use No     History  Drug Use No    History   Social History  . Marital Status: Single    Spouse Name: N/A  . Number of Children: N/A  . Years of Education: N/A   Occupational History  . Student     repeat 11th grade at [employer] Topics  . Smoking status: Never Smoker   . Smokeless tobacco: Never Used  . Alcohol  Use: No  . Drug Use: No  . Sexual Activity: No   Other Topics Concern  . None   Social History Narrative   Additional History:    Sleep: Good  Appetite:  Fair    Musculoskeletal: Strength & Muscle Tone: within normal limits Gait & Station: normal Patient leans: N/A   Psychiatric Specialty Exam: Physical Exam  Review of Systems  Psychiatric/Behavioral: Positive for depression. The patient is nervous/anxious.     Blood pressure 112/74, pulse 85, temperature 98.2 F (36.8 C), temperature source Oral, resp. rate 16, height 5' 5.5" (1.664 m), weight 54.885 kg (121 lb), SpO2 100 %.Body mass index is 19.82 kg/(m^2).  General Appearance: Fairly Groomed  Engineer, water::  improved  Speech:  Slow  Volume:  Decreased  Mood:  Depressed improving  Affect:  Appropriate  Thought Process:  Linear  Orientation:  Full (Time, Place, and Person)  Thought Content:  denies hallucinations ,no delusions  Suicidal Thoughts:  No- at this time denies any suicidal plan or intention- contracts for safety on unit   Homicidal Thoughts:  No- specifically also  Denies any thoughts of hurting family/mother   Memory:  recent and remote grossly intact   Judgement:  Fair  Insight:  Fair  Psychomotor Activity:  Decreased improving  Concentration:  Good  Recall:  Good  Fund of Knowledge:Good  Language: Good  Akathisia:  Negative  Handed:  Right  AIMS (if indicated):     Assets:  Communication Skills Desire for Improvement Talents/Skills Vocational/Educational  ADL's:  Impaired  Cognition: WNL  Sleep:  Number of Hours: 6.75     Current Medications: Current Facility-Administered Medications  Medication Dose Route Frequency Provider Last Rate Last Dose  . acetaminophen (TYLENOL) tablet 650 mg  650 mg Oral Q6H PRN [name] B Rankin, NP      . albuterol (PROVENTIL HFA;VENTOLIN HFA) 108 (90 BASE) MCG/ACT inhaler 2 puff  2 puff Inhalation Q4H Kerrie Buffalo, NP   2 puff at 08/22/14 1204  .  EPINEPHrine (EPI-PEN) injection 0.3 mg  0.3 mg Intramuscular PRN [name] B Rankin, NP      . guaiFENesin (MUCINEX) 12 hr tablet 600 mg  600 mg Oral BID PRN Kerrie Buffalo, NP      . magnesium hydroxide (MILK OF MAGNESIA) suspension 30 mL  30 mL Oral Daily PRN [name] B Rankin, NP      . metoprolol tartrate (LOPRESSOR) tablet 25 mg  25 mg Oral BID Thurnell Lose, MD   25 mg at 08/22/14 0734  . ondansetron (ZOFRAN) tablet 4 mg  4 mg Oral Q8H PRN Thurnell Lose, MD      . pantoprazole (PROTONIX) EC tablet 40 mg  40 mg Oral Daily Thurnell Lose, MD   40 mg at 08/22/14 0734  . sertraline (ZOLOFT) tablet 50 mg  50 mg Oral Daily Jenne Campus, MD   50 mg at 08/22/14 0734  . traZODone (DESYREL) tablet 50 mg  50 mg Oral QHS PRN [name] B Rankin, NP        Lab Results:  Results for orders placed or performed during the hospital encounter of 08/18/14 (from the past 48 hour(s))  CBC with Differential/Platelet     Status: Abnormal   Collection Time: 08/21/14  6:25 AM  Result Value Ref Range   WBC 2.8 (L) 4.0 - 10.5 K/uL   RBC 4.80 3.87 - 5.11 MIL/uL   Hemoglobin 14.0 12.0 - 15.0 g/dL   HCT 41.3 36.0 - 46.0 %   MCV 86.0 78.0 - 100.0 fL   MCH 29.2 26.0 - 34.0 pg   MCHC 33.9 30.0 - 36.0 g/dL   RDW 12.1 11.5 - 15.5 %   Platelets 234 150 - 400 K/uL   Neutrophils Relative % 14 (L) 43 - 77 %   Lymphocytes Relative 75 (H) 12 - 46 %   Monocytes Relative 11 3 - 12 %   Eosinophils Relative 0 0 - 5 %   Basophils Relative 0 0 - 1 %   Neutro Abs 0.4 (L) 1.7 - 7.7 K/uL   Lymphs Abs 2.1 0.7 - 4.0 K/uL   Monocytes Absolute 0.3 0.1 - 1.0 K/uL   Eosinophils Absolute 0.0 0.0 - 0.7 K/uL   Basophils Absolute 0.0 0.0 - 0.1 K/uL   WBC Morphology WHITE COUNT CONFIRMED ON SMEAR     Comment: Performed at [facility]  TSH     Status: None   Collection Time: 08/21/14  6:25 AM  Result Value Ref Range   TSH 0.533 0.350 - 4.500 uIU/mL    Comment: Performed at [facility]     Physical Findings: AIMS: Facial and Oral Movements Muscles of Facial Expression: None, normal Lips and Perioral Area: None, normal Jaw: None, normal Tongue: None, normal,Extremity Movements Upper (arms, wrists, hands, fingers): None, normal Lower (legs, knees, ankles, toes): None, normal, Trunk Movements Neck, shoulders,  hips: None, normal, Overall Severity Severity of abnormal movements (highest score from questions above): None, normal Incapacitation due to abnormal movements: None, normal Patient's awareness of abnormal movements (rate only patient's report): No Awareness, Dental Status Current problems with teeth and/or dentures?: No Does patient usually wear dentures?: No  CIWA:  CIWA-Ar Total: 1 COWS:  COWS Total Score: 1   Assessment- Patient with improvement of her depression, has good insight and is motivated to participate in treatment plan. Discussed family as not being very supportive , CSW will work on referral for family therapy session as well as IOP/PHP.    Treatment Plan Summary: Daily contact with patient to assess and evaluate symptoms and progress in treatment, Medication management, Plan continue inpatient treatment and continue medications as below   1. Continue Zoloft 50 mg po daily for depression. Continue Trazodone 50 mg po qhs prn for sleep.  Patient to be referred to IOP/PHP on discharge .  2. Leukopenia. Per IM team - Likely due to NSAID use, NSAID stopped. Peripheral smear ordered and pending follow with PCP for peripheral smear results.Request to follow with PCP in one week for a repeat CBC and to follow with cultures of peripheral smear.   3. Cough 5 days with low-grade fever on Thursday. Fever has resolved. Nontoxic appearance, complete 5 days of total azithromycin, chest x-ray clear.   4. Low TSH with mild tachycardia.repeat TSH along with free T3 and T4 are stable. Low-dose beta blocker to continue which is helping with her anxiety as well,  tachycardia has resolved. Patient to be referred to endocrinology for follow up as pre recommendation from IM team.                                                 Medical Decision Making:  Established Problem, Stable/Improving (1), New problem, with additional work up planned, Review of Psycho-Social Stressors (1), Review of Last Therapy Session (1), Review or order medicine tests (1) and Review of Medication Regimen & Side Effects (2)     Ashlinn Hemrick md 08/22/2014, 3:33 PM

## 2014-08-22 NOTE — Progress Notes (Signed)
Recreation Therapy Notes  Date: 05.09.2016 Time: 9:30am Location: [address] Group Room   Group Topic: Stress Management  Goal Area(s) Addresses:  Patient will actively participate in stress management techniques presented during session.   Behavioral Response: Did not attend.   Laureen Ochs Naim Murtha, LRT/CTRS        Lane Hacker 08/22/2014 3:31 PM

## 2014-08-22 NOTE — Plan of Care (Signed)
Problem: Consults Goal: Psychosis Patient Education See Patient Education Module for education specifics.  Outcome: Completed/Met Date Met:  08/22/14 Nurse discussed coping skills with patient.

## 2014-08-23 MED ORDER — SERTRALINE HCL 50 MG PO TABS: 50.0000 mg | ORAL_TABLET | Freq: Every day | ORAL | Status: DC

## 2014-08-23 MED ORDER — LORATADINE 10 MG PO TABS: 10.0000 mg | ORAL_TABLET | Freq: Every day | ORAL | Status: AC | PRN

## 2014-08-23 MED ORDER — EPINEPHRINE 0.3 MG/0.3ML IJ SOAJ: 0.3000 mg | Freq: Once | INTRAMUSCULAR | Status: AC | PRN

## 2014-08-23 MED ORDER — TRAZODONE HCL 50 MG PO TABS: 50.0000 mg | ORAL_TABLET | Freq: Every evening | ORAL | Status: DC | PRN

## 2014-08-23 MED ORDER — ALBUTEROL SULFATE HFA 108 (90 BASE) MCG/ACT IN AERS: 2.0000 | INHALATION_SPRAY | Freq: Four times a day (QID) | RESPIRATORY_TRACT | Status: AC | PRN

## 2014-08-23 MED ORDER — METOPROLOL TARTRATE 25 MG PO TABS: 25.0000 mg | ORAL_TABLET | Freq: Two times a day (BID) | ORAL | Status: AC

## 2014-08-23 NOTE — Progress Notes (Signed)
  [facility] Adult Case Management Discharge Plan :  Will you be returning to the same living situation after discharge:  Yes,  Patient will return home with family. At discharge, do you have transportation home?: Yes,  Patient will arrange transportation home. Do you have the ability to pay for your medications: Yes,  Patient can afford medications.  Release of information consent forms completed and in the chart;  Patient's signature needed at discharge.  Patient to Follow up at: Follow-up Information    Follow up with [facility] On 09/08/2014.   Why:  Thursday, Sep 08, 2014 at 9 AM   Contact information:   [address] [place], [place]   [address]  [phone]      Follow up with Dr. Adele Schilder - [facility] On 09/20/2014.   Why:  Tuesday, September 27, 2014 at 1:15 PM   Contact information:   [place], [place]   [address]  [phone]      Follow up with Renato Shin, MD. Schedule an appointment as soon as possible for a visit in 1 week.   Specialty:  Endocrinology   Why:  Repeat TSH, free T3 and T4   Contact information:   [address] [place] [address] [phone]       Follow up with [facility]    . Schedule an appointment as soon as possible for a visit in 1 week.   Why:  Repeat CBC and follow-up on peripheral smear results   Contact information:   [address] [place] [address] [phone]      Patient denies SI/HI: Patient no longer endorsing SI/HI or other thoughts of self harm.   Safety Planning and Suicide Prevention discussed: .Reviewed with all patients during discharge planning group   Have you used any form of tobacco in the last 30 days? (Cigarettes, Smokeless Tobacco, Cigars, and/or Pipes): Patient Refused Screening  Has patient been referred to the Quitline?: No referral needed to quitline.   Concha Pyo 08/23/2014,  8:36 AM

## 2014-08-23 NOTE — BHH Group Notes (Signed)
Adult Psychoeducational Group Note  Date:  08/23/2014 Time:  0900 am  Group Topic/Focus:  Orientation:   The focus of this group is to educate the patient on the purpose and policies of crisis stabilization and provide a format to answer questions about their admission.  The group details unit policies and expectations of patients while admitted.  Participation Level:  Active  Participation Quality:  Appropriate and Attentive  Affect:  Appropriate  Cognitive:  Alert and Appropriate  Insight: Appropriate and Good  Engagement in Group:  Engaged  Modes of Intervention:  Discussion, Education and Orientation  Additional Comments:  Pt was attentive during group, discussed that she is eager to find out what time she will be discharged today, looking forward to discharge.  Wynn Banker 08/23/2014, 9:06 AM

## 2014-08-23 NOTE — Progress Notes (Signed)
D/C instructions/meds/follow-up appointments reviewed, pt verbalized understanding, pt's belongings returned to pt, samples given. 

## 2014-08-23 NOTE — Progress Notes (Signed)
Pt has been in the dayroom all evening talking with peers and watching TV.  She attended evening group and participated.  Pt reports she has had a good day and voiced no needs or concerns.  She feels her medications are working and she is feeling better physically than when she was admitted.  She denies SI/HI/AVH at this time.  She is pleasant and appropriate with staff and peers.  Pt makes her needs known to staff.  Pt states she is being discharged Tuesday to home and feels ready to go.  Support and encouragement offered.  Safety maintained with q15 minute checks.

## 2014-08-23 NOTE — Progress Notes (Signed)
Recreation Therapy Notes  Animal-Assisted Activity (AAA) Program Checklist/Progress Notes Patient Eligibility Criteria Checklist & Daily Group note for Rec Tx Intervention  Date: 05.10.2016 Time: 2:45pm Location: [address]    AAA/T Program Assumption of Risk Form signed by Patient/ or Parent Legal Guardian yes  Patient is free of allergies or sever asthma yes  Patient reports no fear of animals yes  Patient reports no history of cruelty to animals yes  Patient understands his/her participation is voluntary yes  Patient washes hands before animal contact yes  Patient washes hands after animal contact yes  Behavioral Response: Appropriate   Education: Hand Washing, Appropriate Animal Interaction   Education Outcome: Acknowledges education.   Clinical Observations/Feedback: Patient engaged appropriate with peers and therapy dog. Patient was asked to leave session at approximately 2:55pm to prepare for d/c.   Laureen Ochs Markayla Reichart, LRT/CTRS  Tayshawn Purnell L 08/23/2014 4:05 PM

## 2014-08-23 NOTE — [facility] Suicide Risk Assessment (Signed)
[facility] Discharge Suicide Risk Assessment   Demographic Factors:  19 year old single female, lives with parents, college student  Total Time spent with patient: 30 minutes  Musculoskeletal: Strength & Muscle Tone: within normal limits Gait & Station: normal Patient leans: N/A  Psychiatric Specialty Exam: Physical Exam  ROS  Blood pressure 113/82, pulse 78, temperature 98.2 F (36.8 C), temperature source Oral, resp. rate 16, height 5' 5.5" (1.664 m), weight 121 lb (54.885 kg), SpO2 100 %.Body mass index is 19.82 kg/(m^2).  General Appearance: improved grooming  Eye Contact::  Good  Speech:  Normal Rate409  Volume:  Normal  Mood:  improved, less depressed   Affect:  more reactive, smiles appropriately during session  Thought Process:  Linear  Orientation:  Full (Time, Place, and Person)  Thought Content:  no thought disorder  denies hallucinations, no delusions , not internally preoccupied   Suicidal Thoughts:  No at this time denies any thoughts of hurting self and  contracts for safety on unit   Homicidal Thoughts:  No  Memory:  recent and remote grossly intact n  Judgement:  Other:  improved   Insight:  Good  Psychomotor Activity:  Normal  Concentration:  Good  Recall:  Good  Fund of Knowledge:Good  Language: Good  Akathisia:  Negative  Handed:  Right  AIMS (if indicated):     Assets:  Communication Skills Desire for Improvement Social Support Talents/Skills  Sleep:  Number of Hours: 6.75  Cognition: WNL  ADL's:  Improved    Have you used any form of tobacco in the last 30 days? (Cigarettes, Smokeless Tobacco, Cigars, and/or Pipes): Patient Refused Screening  Has this patient used any form of tobacco in the last 30 days? (Cigarettes, Smokeless Tobacco, Cigars, and/or Pipes) No  Mental Status Per Nursing Assessment::   On Admission:  Suicidal ideation indicated by patient  Current Mental Status by Physician: Today patient is improved compared to admission- she is   Presenting with improved mood, improved range of affect, no SI or HI, and is future oriented . No psychotic symptoms at this time. Regarding her physical symptoms, she is also improved - no fever, no chills, no malaise, no SOB, and feels " better". Denies medication side effects.  Loss Factors: Worsening grades at college, recent acute infectious/febrile illness, strained relationship with mother   Historical Factors: history of depression and one prior psychiatric admission as a teenager  Risk Reduction Factors:   Sense of responsibility to family, Living with another person, especially a relative, Positive social support and Positive coping skills or problem solving skills  Continued Clinical Symptoms:  As above, currently significantly improved and no SI or HI . No psychotic symptoms. Mood much improved .  Cognitive Features That Contribute To Risk:  No gross cognitive deficits noted upon discharge. Is alert , attentive, and oriented x 3   Suicide Risk:  Mild:  Suicidal ideation of limited frequency, intensity, duration, and specificity.  There are no identifiable plans, no associated intent, mild dysphoria and related symptoms, good self-control (both objective and subjective assessment), few other risk factors, and identifiable protective factors, including available and accessible social support.  Principal Problem: Major depressive disorder, recurrent, severe without psychotic features Discharge Diagnoses:  Patient Active Problem List   Diagnosis Date Noted  . Leukopenia [D72.819] 08/22/2014  . Neutropenia [D70.9] 08/22/2014  . Major depressive disorder, recurrent, severe without psychotic features [F33.2]   . Cough [R05]   . Parent-child relational problem [Z62.820] 09/30/2011  Follow-up Information    Follow up with [facility] On 09/08/2014.   Why:  Thursday, Sep 08, 2014 at 9 AM   Contact information:   [address] [place], [place]  [phone]      Follow up with Dr. Adele Schilder - [facility] On 09/20/2014.   Why:  Tuesday, September 27, 2014 at 1:15 PM   Contact information:   [place], [place]   [address]  [phone]      Follow up with Renato Shin, MD. Schedule an appointment as soon as possible for a visit in 1 week.   Specialty:  Endocrinology   Why:  Repeat TSH, free T3 and T4   Contact information:   [address] [place] [address] [phone]       Follow up with [facility]    . Schedule an appointment as soon as possible for a visit in 1 week.   Why:  Repeat CBC and follow-up on peripheral smear results   Contact information:   [place] [address] [phone]      Plan Of Care/Follow-up recommendations:  Activity:  as tolerated Diet:  Regular Tests:  NA Other:  See below  Is patient on multiple antipsychotic therapies at discharge:  No   Has Patient had three or more failed trials of antipsychotic monotherapy by history:  No  Recommended Plan for Multiple Antipsychotic Therapies: NA   Patient is leaving unit in good spirits. She is returning home after discharge. Plans to follow up as above. Also, interested in participating in [facility] .      Kristianna Saperstein, [place] 08/23/2014, 12:31 PM

## 2014-08-23 NOTE — Discharge Summary (Signed)
Physician Discharge Summary Note  Patient:  Kathleen [name] is an 19 y.o., female MRN:  [id number] DOB:  [birth year] Patient phone:  [phone] (home)  Patient address:   [address] [place] [address],  Total Time spent with patient: Greater than 30 minutes  Date of Admission:  08/18/2014  Date of Discharge: 08-22-13  Reason for Admission: Worsening symptoms of depression  Principal Problem: Major depressive disorder, recurrent, severe without psychotic features Discharge Diagnoses: Patient Active Problem List   Diagnosis Date Noted  . Leukopenia [D72.819] 08/22/2014  . Neutropenia [D70.9] 08/22/2014  . Major depressive disorder, recurrent, severe without psychotic features [F33.2]   . Cough [R05]   . Parent-child relational problem [Z62.820] 09/30/2011   Musculoskeletal: Strength & Muscle Tone: within normal limits Gait & Station: normal Patient leans: N/A  Psychiatric Specialty Exam: Physical Exam  Psychiatric: Her speech is normal and behavior is normal. Judgment and thought content normal. Her mood appears not anxious. Her affect is not angry, not blunt, not labile and not inappropriate. Cognition and memory are normal. She does not exhibit a depressed mood.    Review of Systems  Constitutional: Negative.   HENT: Negative.   Eyes: Negative.   Respiratory: Negative.   Cardiovascular: Negative.   Gastrointestinal: Negative.   Genitourinary: Negative.   Musculoskeletal: Negative.   Neurological: Negative.   Endo/Heme/Allergies: Negative.   Psychiatric/Behavioral: Positive for depression (Stable). Negative for suicidal ideas, hallucinations, memory loss and substance abuse. The patient has insomnia (Stable). The patient is not nervous/anxious.     Blood pressure 113/82, pulse 78, temperature 98.2 F (36.8 C), temperature source Oral, resp. rate 16, height 5' 5.5" (1.664 m), weight 54.885 kg (121 lb), SpO2 100 %.Body mass index is 19.82 kg/(m^2).  See Md's  SRA   Have you used any form of tobacco in the last 30 days? (Cigarettes, Smokeless Tobacco, Cigars, and/or Pipes): Patient Refused Screening   Has this patient used any form of tobacco in the last 30 days? (Cigarettes, Smokeless Tobacco, Cigars, and/or Pipes) No  Past Medical History:  Past Medical History  Diagnosis Date  . Acne   . Environmental allergies    History reviewed. No pertinent past surgical history. Family History: History reviewed. No pertinent family history. Social History:  History  Alcohol Use No     History  Drug Use No    History   Social History  . Marital Status: Single    Spouse Name: N/A  . Number of Children: N/A  . Years of Education: N/A   Occupational History  . Student     repeat 11th grade at [employer]  . Smoking status: Never Smoker   . Smokeless tobacco: Never Used  . Alcohol Use: No  . Drug Use: No  . Sexual Activity: No   Other Topics Concern  . None   Social History Narrative   Risk to Self: Suicidal Ideation: Yes-Currently Present Suicidal Intent: Yes-Currently Present Is patient at risk for suicide?: Yes Suicidal Plan?: No Access to Means: No What has been your use of drugs/alcohol within the last 12 months?: Patient denies How many times?: 1 Other Self Harm Risks: NA Triggers for Past Attempts: Family contact Intentional Self Injurious Behavior: None Risk to Others: Homicidal Ideation: Yes-Currently Present Thoughts of Harm to Others: Yes-Currently Present Comment - Thoughts of Harm to Others: thoughts of harm to family Current Homicidal Intent: No Current Homicidal Plan: No Access to Homicidal Means: No Identified  Victim: NA History of harm to others?: No Assessment of Violence: None Noted Violent Behavior Description: NA Does patient have access to weapons?: No Criminal Charges Pending?: No Does patient have a court date: No Prior Inpatient Therapy: Prior Inpatient Therapy:  Yes Prior Therapy Dates: 2013 Prior Therapy Facilty/Provider(s): [facility] Reason for Treatment: Depression Prior Outpatient Therapy: Prior Outpatient Therapy: Yes Prior Therapy Dates: 2016 Prior Therapy Facilty/Provider(s): [facility] Reason for Treatment: Depression Does patient have an ACCT team?: No Does patient have Intensive In-House Services?  : No Does patient have [facility] services? : No Does patient have P4CC services?: No  Level of Care:  OP  Hospital Course:  Patient is 19 years old. She is single, lives with parents and is enrolled in college. She reports depression, sadness. States she has been struggling with depression for several weeks to months, and tends to ruminate about worsening academic performance, grades, which in turn she also attributes to depression, stating that it is hard for her to feel interest or energy to study as much as she needs to. She states she has family stressors, and states that her mother in particular " does not believe me that I am not feeling well". Reports that parents expect her to excel/ do well.  Kathleen [name] was admitted to the hospital for worsening symptoms of depression requiring mood stabilization treatment. After evaluation of her symptoms, She was started on medication regimen for her presenting symptoms. Her medication regimen included; Sertraline 50 mg for depression andTrazodone 50 mg for insomnia. She was also enrolled & participated in the group counseling sessions being offered and held on this unit, she learned coping skills that should help her cope better and maintain mood stability after discharge. She presented other significant pre-existing health issues that required treatment and or monitoring. She was resumed on all her pertinent home medications for those health issues. She tolerated her treatment regimen without any significant adverse effects and or reactions.  Kathleen [name]'s symptoms were evaluated on daily basis by a clinical  provider to ascertain her symptoms are responding to her treatment regimen. This is evidenced by her reports of decreasing symptoms, improved mood, sleep, appetite and presentation of good affect. She is currently being discharged to continue psychiatric treatment and medication management as noted below. She is provided with all the pertinent information required to make these appointments without problems.   On this day of her hospital discharge, Kathleen [name] is in much improved condition than upon admission. She contracted for her safety and felt more in control of her mood. Her symptoms were reported as significantly decreased or resolved completely. Upon discharge, Kathleen [name] denies any SI/HI and voiced no AVH. She is instructed & motivated to continue taking medications with a goal of continued improvement in mental health. She was provided with some samples of [facility] discharge medications. She was picked up by family. She left [facility] in no apparent distress with all belongings.  Consults:  psychiatry  Significant Diagnostic Studies:  labs: CBC with diff, CMP, UDS, toxicology tests, results reviewed, stable  Discharge Vitals:   Blood pressure 113/82, pulse 78, temperature 98.2 F (36.8 C), temperature source Oral, resp. rate 16, height 5' 5.5" (1.664 m), weight 54.885 kg (121 lb), SpO2 100 %. Body mass index is 19.82 kg/(m^2). Lab Results:   Results for orders placed or performed during the hospital encounter of 08/18/14 (from the past 72 hour(s))  CBC with Differential/Platelet     Status: Abnormal   Collection Time: 08/21/14  6:25 AM  Result Value Ref Range   WBC 2.8 (L) 4.0 - 10.5 K/uL   RBC 4.80 3.87 - 5.11 MIL/uL   Hemoglobin 14.0 12.0 - 15.0 g/dL   HCT 41.3 36.0 - 46.0 %   MCV 86.0 78.0 - 100.0 fL   MCH 29.2 26.0 - 34.0 pg   MCHC 33.9 30.0 - 36.0 g/dL   RDW 12.1 11.5 - 15.5 %   Platelets 234 150 - 400 K/uL   Neutrophils Relative % 14 (L) 43 - 77 %   Lymphocytes Relative 75 (H) 12 - 46 %    Monocytes Relative 11 3 - 12 %   Eosinophils Relative 0 0 - 5 %   Basophils Relative 0 0 - 1 %   Neutro Abs 0.4 (L) 1.7 - 7.7 K/uL   Lymphs Abs 2.1 0.7 - 4.0 K/uL   Monocytes Absolute 0.3 0.1 - 1.0 K/uL   Eosinophils Absolute 0.0 0.0 - 0.7 K/uL   Basophils Absolute 0.0 0.0 - 0.1 K/uL   WBC Morphology WHITE COUNT CONFIRMED ON SMEAR     Comment: Performed at [facility]  TSH     Status: None   Collection Time: 08/21/14  6:25 AM  Result Value Ref Range   TSH 0.533 0.350 - 4.500 uIU/mL    Comment: Performed at [facility]    Physical Findings: AIMS: Facial and Oral Movements Muscles of Facial Expression: None, normal Lips and Perioral Area: None, normal Jaw: None, normal Tongue: None, normal,Extremity Movements Upper (arms, wrists, hands, fingers): None, normal Lower (legs, knees, ankles, toes): None, normal, Trunk Movements Neck, shoulders, hips: None, normal, Overall Severity Severity of abnormal movements (highest score from questions above): None, normal Incapacitation due to abnormal movements: None, normal Patient's awareness of abnormal movements (rate only patient's report): No Awareness, Dental Status Current problems with teeth and/or dentures?: No Does patient usually wear dentures?: No  CIWA:  CIWA-Ar Total: 1 COWS:  COWS Total Score: 1   See Psychiatric Specialty Exam and Suicide Risk Assessment completed by Attending Physician prior to discharge.  Discharge destination:  Home  Is patient on multiple antipsychotic therapies at discharge:  No   Has Patient had three or more failed trials of antipsychotic monotherapy by history:  No  Recommended Plan for Multiple Antipsychotic Therapies: NA    Medication List    STOP taking these medications        diphenhydrAMINE 12.5 MG/5ML liquid  Commonly known as:  BENADRYL     ibuprofen 200 MG tablet  Commonly known as:  ADVIL,MOTRIN      TAKE these medications       Indication   albuterol 108 (90 BASE) MCG/ACT inhaler  Commonly known as:  PROVENTIL HFA;VENTOLIN HFA  Inhale 2 puffs into the lungs every 6 (six) hours as needed for wheezing or shortness of breath.   Indication:  Asthma     EPINEPHrine 0.3 mg/0.3 mL Soaj injection  Commonly known as:  EPI-PEN  Inject 0.3 mLs (0.3 mg total) into the muscle once as needed (For anaphylaxis.).   Indication:  Life-Threatening Allergic Reaction     loratadine 10 MG tablet  Commonly known as:  CLARITIN  Take 1 tablet (10 mg total) by mouth daily as needed for allergies.   Indication:  Life-Threatening Allergic Reaction, Perennial Rhinitis, Hayfever     metoprolol tartrate 25 MG tablet  Commonly known as:  LOPRESSOR  Take 1 tablet (25 mg total) by mouth 2 (two) times daily. For  high blood pressure   Indication:  High Blood Pressure     sertraline 50 MG tablet  Commonly known as:  ZOLOFT  Take 1 tablet (50 mg total) by mouth daily. For depression   Indication:  Major Depressive Disorder     traZODone 50 MG tablet  Commonly known as:  DESYREL  Take 1 tablet (50 mg total) by mouth at bedtime as needed for sleep.   Indication:  Trouble Sleeping       Follow-up Information    Follow up with [facility] On 09/08/2014.   Why:  Thursday, Sep 08, 2014 at 9 AM   Contact information:   [address] [place], [place]   [address]  [phone]      Follow up with Dr. Adele Schilder - [facility] On 09/20/2014.   Why:  Tuesday, September 27, 2014 at 1:15 PM   Contact information:   [place], [place]   [address]  [phone]      Follow up with Renato Shin, MD. Schedule an appointment as soon as possible for a visit in 1 week.   Specialty:  Endocrinology   Why:  Repeat TSH, free T3 and T4   Contact information:   [address] [place] [address] [phone]       Follow up with [facility]    . Schedule an  appointment as soon as possible for a visit in 1 week.   Why:  Repeat CBC and follow-up on peripheral smear results   Contact information:   [place] [address] [phone]     Follow-up recommendations: Activity:  As tolerated Diet: As recommended by your primary care doctor. Keep all scheduled follow-up appointments as recommended.   Comments: Take all your medications as prescribed by your mental healthcare provider. Report any adverse effects and or reactions from your medicines to your outpatient provider promptly. Patient is instructed and cautioned to not engage in alcohol and or illegal drug use while on prescription medicines. In the event of worsening symptoms, patient is instructed to call the crisis hotline, 911 and or go to the nearest ED for appropriate evaluation and treatment of symptoms. Follow-up with your primary care provider for your other medical issues, concerns and or health care needs.   Total Discharge Time: Greater than 30 minutes  Signed: Encarnacion Slates, PMHNP-BC 08/23/2014, 9:56 AM   Patient seen, Suicide Assessment Completed.  Disposition Plan Reviewed

## 2014-08-23 NOTE — Tx Team (Signed)
Interdisciplinary Treatment Plan Update (Adult)  Date:  08/23/2014  Time Reviewed:  8:34 AM   Progress in Treatment: Attending groups: Patient is attending groups. Participating in groups:  Patient engages in discussion Taking medication as prescribed:  Patient is taking medications Tolerating medication:  Patient is tolerating medications Family/Significant othe contact made:   No, mother did not return calls. Patient understands diagnosis:Yes, patient understands diagnosis and need for treatment Discussing patient identified problems/goals with staff:  Yes, patient is able to express goals/problems Medical problems stabilized or resolved:  Yes Denies suicidal/homicidal ideation: Yes, patient is denying SI/HI. Issues/concerns per patient self-inventory:   Other:   Discharge Plan or Barriers:  Home with follow up with [facility]  Reason for Continuation of Hospitalization:  Comments:   Additional comments:  Patient and CSW reviewed Patient Discharge Process Letter/Patient Involvement Form.  Patient verbalized understanding and signed form.  Patient and CSW also reviewed and identified patient's goals and treatment plan.  Patient verbalized understanding and agreed to plan.  Estimated length of stay:  Discharge today  New goal(s):  Review of initial/current patient goals per problem list:     Attendees: Patient 08/23/2014 8:34 AM   Family:   08/23/2014 8:34 AM   Physician:  Neita Garnet, MD 08/23/2014 8:34 AM   Nursing:    08/23/2014 8:34 AM   Clinical Social Worker:  Joette Catching, [place] 08/23/2014 8:34 AM   Clinical Social Worker:  Tilden Fossa, LCSW-A 08/23/2014 8:34 AM   Case Manager:  Lars Pinks, RN 08/23/2014 8:34 AM   Other:  Markham Jordan, RN 08/23/2014 8:34 AM  Other:  Mayra Neer, RN 08/23/2014  8:34 AM   Other:  08/23/2014 8:34 AM   Other:  08/23/2014 8:34 AM   Other:  08/23/2014 8:34 AM   Other:  Jake Bathe Transition Team  Coordinator 08/23/2014 8:34 AM   Other:   08/23/2014 8:34 AM   Other:  08/23/2014 8:34 AM   Other:   08/23/2014 8:34 AM    Scribe for Treatment Team:   Concha Pyo, 08/23/2014   8:34 AM

## 2014-08-24 ENCOUNTER — Other Ambulatory Visit (HOSPITAL_COMMUNITY): Payer: Federal, State, Local not specified - PPO | Admitting: Licensed Clinical Social Worker

## 2014-08-24 ENCOUNTER — Other Ambulatory Visit (HOSPITAL_COMMUNITY): Payer: Federal, State, Local not specified - PPO

## 2014-08-24 ENCOUNTER — Encounter (HOSPITAL_COMMUNITY): Payer: Self-pay | Admitting: Licensed Clinical Social Worker

## 2014-08-24 DIAGNOSIS — G4700 Insomnia, unspecified: Secondary | ICD-10-CM | POA: Insufficient documentation

## 2014-08-24 DIAGNOSIS — F419 Anxiety disorder, unspecified: Secondary | ICD-10-CM

## 2014-08-24 DIAGNOSIS — F322 Major depressive disorder, single episode, severe without psychotic features: Secondary | ICD-10-CM | POA: Insufficient documentation

## 2014-08-24 DIAGNOSIS — F4010 Social phobia, unspecified: Secondary | ICD-10-CM | POA: Insufficient documentation

## 2014-08-24 DIAGNOSIS — F332 Major depressive disorder, recurrent severe without psychotic features: Secondary | ICD-10-CM
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
## 2014-08-25 ENCOUNTER — Encounter (HOSPITAL_COMMUNITY): Payer: Self-pay | Admitting: Licensed Clinical Social Worker

## 2014-08-25 ENCOUNTER — Other Ambulatory Visit (HOSPITAL_COMMUNITY): Payer: Federal, State, Local not specified - PPO | Attending: Psychiatry | Admitting: Licensed Clinical Social Worker

## 2014-08-25 DIAGNOSIS — F332 Major depressive disorder, recurrent severe without psychotic features: Secondary | ICD-10-CM

## 2014-08-25 DIAGNOSIS — G4700 Insomnia, unspecified: Secondary | ICD-10-CM | POA: Diagnosis not present

## 2014-08-25 DIAGNOSIS — F322 Major depressive disorder, single episode, severe without psychotic features: Secondary | ICD-10-CM | POA: Diagnosis not present

## 2014-08-25 DIAGNOSIS — F419 Anxiety disorder, unspecified: Secondary | ICD-10-CM

## 2014-08-25 DIAGNOSIS — F4010 Social phobia, unspecified: Secondary | ICD-10-CM | POA: Diagnosis not present

## 2014-08-25 NOTE — Psych (Signed)
   [facility] PHP THERAPIST PROGRESS NOTE  SHAWN DANNENBERG [id number]   Date: 08/25/14 Time: 11:00 AM-12:30 PM  Group Topic/Focus:  Healthy Coping Strategies for Depression and Anxiety  Participation Level:  Active  Participation Quality:  Appropriate, Attentive and Sharing  Affect:  Appropriate  Cognitive:  Alert and Appropriate  Insight: Appropriate  Engagement in Group:  Engaged  Modes of Intervention:  Discussion, Education, Exploration and Problem-solving  Additional Comments: Group topic for today was on healthy coping strategies. Group began with check in. Kathleen [name] described that she needs to work on depression, anxiety when she is out in social situations and stress management. She described her stressors as school, home environment and that what is going on does not affect her job. She related to group that she needs better supports. She identified effective strategies that work for her including music, drawing, gardening, baking, animal therapy and that she bought a Clinical cytogeneticist It" Journal. Therapist pointed out that you have to use the ones that work the best for you. It appears that anger is an issue and therapist discussed cognitive strategies of challenging the cognitions that make cause you to feel angry. You can help to make you cognitions less emotionally loaded by asking yourself questions that help to give you a better perspective. My view is that addressing coping strategies for anger is helpful for this client.   Suicidal/Homicidal: no  Plan:1.Therapist provide positive reinforcement and insight to patient's effective coping strategies.2.Patient learn more about coping strategies that will help her and apply these strategies to life situations.   [facility] PHP THERAPIST GROUP NOTE  Date: 08/25/14  Time: 1:00 PM-2:00 PM   Group Topic/Focus:  Wellness Toolbox:   The focus of the group was on building a wellness plan  Participation Level:  Active  Participation Quality:   Sharing  Affect:  Appropriate  Cognitive:  Alert  Insight: Appropriate  Engagement in Group:  Engaged  Modes of Intervention:  Activity, Discussion, Exploration and Problem-solving  Additional Comments:  The focus of the group was on building a wellness plan. Therapist conveyed the message through videos with different messages and discussion of videos. The group agreed that humor was an effective way to help with depression and anxiety. Kathleen [name] showed a video of a funny sketch that she explained was like her Mom at home. Therapist related that it gave a good description of how chaotic her life at home must be and she agreed. She says her coping strategy is to try to be understanding. It is clear that patient needs support and interventions to help manage the stress of home life. She described some of the stressors of the family and therapist explained that when there is stress in the family it can find negative ways to come out. Kathleen [name] agrees that the family has to find ways to better communicate and will think about a family meeting.  Suicidal/Homicidal: no  Plan:1.Therapist provide supportive interventions and help patient with strategies to manage family stress.2.Patient apply stress management strategies to manage dysfunctions in family system.     Diagnosis: No Primary Diagnosis found   No diagnosis found.    Ashkan Chamberland A 08/25/2014

## 2014-08-25 NOTE — Psych (Signed)
[facility] Behavioral Health Partial Program Assessment Note  Date: 08/25/2014 Name: Kathleen [name] MRN: [id number]  Chief Complaint:  " I guess I am still having problems with low self esteem"    HPI: Patient is a 19 y.o. African American female presents with  History of depression and anxiety.  Patient was enrolled in partial psychiatric program on 08/25/2014.   I have reviewed the following documentation dated 08/18/14- 08/23/14.   Patient is a 19 year old female, known to me from prior inpatient psychiatric admission at [facility] ( 08/18/14- 08/23/14) .  She had been admitted due to worsening depression and emergence of SI.  Patient had been feeling depressed and also  Feeling acutely sick/ physically ill, with symptoms of acute respiratory infection ( cough, myalgias, headache, fever) . States she had wanted to stay home from college due to feeling ill, but that mother did not believe her and made her go. Patient has a history of some tension with her parents, and in college felt intensely depressed, sad, and developed vague SI, due to which she was brought to hospital. On the psychiatric unit was initially quite symptomatic with myalgias, weakness,dry cough, dizziness, but slowly improved . Work up was negative, to include negative HIV, negative Flu . CBC was remarkable for low WBC, which slowly improved/increased.  She started feeling better and was discharged home.  She states she continues to do better, but still does not feel back to " normal" and describes some lingering neuro-vegetative symptoms of depression, particularly low energy, less than normal appetite,  and mild anhedonia. She does state she is feeling much better than prior to inpatient admission. She  Denies any ongoing suicidal ideations. She has not had any panic attacks, but describes ongoing and long term symptoms of social anxiety, including being " introverted, shy" , and feeling very uncomfortable when having to speak in front of  people, feeling she is going to be judged or be embarrassed .  She is taking medications and denies side effects. ( Was started on Zoloft and on Trazodone ) . States these medications have helped and denies any increase in agitation or increased suicidal /self injurious ideations. As noted, currently denies any thoughts of wanting to harm self . Patient states she struggles with low self esteem, and describes it as a 1/10. She feels she should " like to be someone else, someone more extroverted ".  She often feels vaguely guilty, for no particular reason. Denies any hallucinations or psychotic symptoms.   Complaints of Pain: denies pain issues at this time.  Currently intreatment with  Zoloft and Trazodone .  Past Psychiatric History- She has a history of chronic, recurrent  Depression. There is a history of self cutting  But stopped  age 19. One prior admission to adolescent unit in  2013 , for  depression and suicidal gesture.. No  history of psychosis,mania, or PTSD. Does describe  Social Phobia, and describes self as shy and tending to be introverted.  She had not been taking any psychiatric medications recently. Had been on Zoloft in the past, but only for a few weeks. No side effects .     Substance Abuse History: No history of alcohol or drug abuse   No past surgical history on file.  Past Medical History  Diagnosis Date  . Acne   . Environmental allergies    Outpatient Encounter Prescriptions as of 08/25/2014  . Order #: [id number] Class: No Print  . Order #: [id number] Class:  No Print  . Order #: [id number] Class: No Print  . Order #: [id number] Class: Normal  . Order #: [id number] Class: Normal  . Order #: [id number] Class: Normal   Allergies  Allergen Reactions  . Shellfish Allergy Anaphylaxis and Hives  . Fish Allergy Hives    History  Substance Use Topics  . Smoking status: Never Smoker   . Smokeless tobacco: Never Used  . Alcohol Use: No   Social History-  Single,  no children, no SO at this time. Electronics engineer , currently on vacation. Lives with mother and father. States relationship with parents stressful, and reports mother in particular expects her to excel academically at all times, causing her to feel pressured and depressed if she does not do as well. She works as a Investment banker, corporate at [employer] .   Family History- father has history of alcohol dependence. One cousin, who lives at home with them, is currently in a [facility] 5 [facility] for making bomb threats at school. Family History  Problem Relation Age of Onset  . Alcohol abuse Father   . High blood pressure Father   . Lymphoma Sister   . Breast cancer Other      Review of Systems No fever, no chills, has lost weight but feels she may be starting to regain some, no sore throat, no congestion, no ear aches, no chest pain, ongoing dry cough, but improving, no SOB, no wheezing, no abdominal pain, no diarrhea, no vomiting, no dysuria or urgency, no peripheral edema or rash, no easy bruising or bleeding, no seizures. 9+) history of anxiety, depression. All other systems negative    There were no vitals filed for this visit.   Mental Status Exam: Appearance:  Casually dressed Psychomotor::  Within Normal Limits Attention span and concentration: Normal Behavior: calm, cooperative and adequate rapport can be established Speech:  soft Mood:  Improved but still somewhat depressed, anxious  Affect:  normal and constricted but more reactive  Thought Process:  within normal limits Thought Content:  not suicidal, not homicidal and no hallucinations, no delusions Orientation:  person, place, time/date and situation Cognition:  grossly intact Insight:  Intact Judgment:  Intact Estimate of Intelligence: Average Fund of knowledge: Aware of current events Memory: Recent and remote intact Abnormal movements: None Gait and station: Normal  Assessment: Patient is a 19 year old single  female, who was recently admitted to inpatient psychiatric unit for worsening depression and emergence of suicidal ideations. She has  A prior history of depression, anxiety, social phobia, and had been admitted once before to the adolescent unit for depression/SI. She had not been taking any psychiatric medications prior to this admission, but had been on Zoloft in the past for a brief period of time without side effects. In the context of worsening grades in college, relationship stress with mother due to her perception that mother has very high expectations of her , and being acutely medically ill with what appears to have been a serious upper respiratory viral ( ?) infection, she had become more depressed and had developed SI , leading to admission. D/C 08/23/14.  Now better, no SI, feeling better physically as well. Still somewhat depressed, and reporting ongoing chronic anxiety, particularly Social Phobia, and a chronic sense of low self esteem. She is on Zoloft and Trazodone and does feel these medications are helping.   Diagnosis: Major Depression, Severe, without psychotic symptoms  Social Phobia by History  Indications for admission: severity of  depression, anxiety, and ongoing struggles with low self esteem and sense of low self worth. Warrants PHP to minimize risk or exacerbation of symptoms, readmission.   Plan: PHP- address strategies , coping skills, ego strengths to address depression, social anxiety, relationship issues . Continue Zoloft 50 mgrs QDAY - for depression and social anxiety-  we have reviewed side effects and patient is aware of potential for increased SI with SSRIS in teenagers .  Continue Trazodone 50 mgrs QHS - for insomnia   Treatment options and alternatives reviewed with patient and patient understands the above plan.      Neita Garnet, MD

## 2014-08-26 ENCOUNTER — Ambulatory Visit (HOSPITAL_COMMUNITY): Payer: Self-pay | Admitting: Licensed Clinical Social Worker

## 2014-08-26 ENCOUNTER — Other Ambulatory Visit (HOSPITAL_COMMUNITY): Payer: Self-pay

## 2014-08-26 DIAGNOSIS — F419 Anxiety disorder, unspecified: Secondary | ICD-10-CM

## 2014-08-26 DIAGNOSIS — F332 Major depressive disorder, recurrent severe without psychotic features: Secondary | ICD-10-CM

## 2014-08-26 NOTE — Psych (Signed)
   [facility] PHP THERAPIST PROGRESS NOTE  Kathleen [name] [id number]  Date:  08/25/14 Time:  5:05 PM  Group Topic/Focus: Emotional Education:   The focus of this group is to discuss what feelings/emotions are, and how they are experienced. Identifying Needs:   The focus of this group is to help patients identify their personal needs that have been historically problematic and identify healthy behaviors to address their needs. Making Healthy Choices:   The focus of this group is to help patients identify negative/unhealthy choices they were using prior to admission and identify positive/healthier coping strategies to replace them upon discharge. Managing Feelings:   The focus of this group is to identify what feelings patients have difficulty handling and develop a plan to handle them in a healthier way upon discharge.  Participation Level:  Active  Participation Quality:  Sharing  Affect:  Anxious, Blunted and Flat  Cognitive:  Confused  Insight: Limited  Engagement in Group:  Limited  Modes of Intervention:  Activity, Discussion, Exploration and Reality Testing  Additional Comments: Clips shown and processed from the movie "Inside Out" were shown and emotional responses to unrealistic distortions were examined by identifying distorted patterns in thinking in comparison to emotions being experienced in the presenting moment. The group collaboratively assisted in identifying steps that could be taken to begin resolving issues that contribute to their issues with persistent fear and worry. As the group gained understanding of their role in the cycle of anxiety, emotional responses and their ability to challenge real vs. false beliefs, it was expressed that it has helped to explain, identify and projected to resolve life conflicts. Each group member appeared to display insight into unresolved conflicts and how they contribute to his/her persistent fears. Techniques, strategies, coping mechanisms  and interventions were discussed and developed with the group as a whole. Unhealthy responses were processed and alternative interventions were gently offered in this area.  Suicidal/Homicidal: Negativewithout intent/plan  Plan:  (1) Pt will return to [facility] on 08/26/14 (2) Pt will discuss and plan for a family therapy session (3) Pt will meet with Dr.Cabos for follow-up (4) Pt will continue to take all medications as prescribed (5) Pt will complete all assignments before returning to PHP   Diagnosis: Primary Diagnosis: Major depressive disorder, recurrent, severe without psychotic features [F33.2]    1. Major depressive disorder, recurrent, severe without psychotic features   2. Anxiety disorder, unspecified anxiety disorder type       Kathleen Cornia, LCSW 08/25/14

## 2014-08-29 ENCOUNTER — Other Ambulatory Visit (HOSPITAL_COMMUNITY): Payer: Self-pay | Admitting: Licensed Clinical Social Worker

## 2014-08-29 ENCOUNTER — Telehealth (HOSPITAL_COMMUNITY): Payer: Self-pay | Admitting: Licensed Clinical Social Worker

## 2014-08-30 ENCOUNTER — Other Ambulatory Visit (HOSPITAL_COMMUNITY): Payer: Self-pay

## 2014-08-31 ENCOUNTER — Other Ambulatory Visit (HOSPITAL_COMMUNITY): Payer: Federal, State, Local not specified - PPO | Admitting: Psychiatry

## 2014-08-31 DIAGNOSIS — F419 Anxiety disorder, unspecified: Secondary | ICD-10-CM

## 2014-08-31 DIAGNOSIS — F332 Major depressive disorder, recurrent severe without psychotic features: Secondary | ICD-10-CM

## 2014-08-31 DIAGNOSIS — F322 Major depressive disorder, single episode, severe without psychotic features: Secondary | ICD-10-CM | POA: Diagnosis not present

## 2014-08-31 NOTE — Psych (Signed)
[facility] BH PHP THERAPIST PROGRESS NOTE  YAMILKA LOPICCOLO [id number]   Date: 08/31/14  Time: 11:00 AM-12:00 PM  Group Topic/Focus:  Problem Solving to Manage Stressors  Participation Level:  Active  Participation Quality:  Appropriate and Sharing  Affect:  Appropriate and Euthymic  Cognitive:  Alert and Appropriate  Insight: Improving  Engagement in Group:  Engaged  Modes of Intervention:  Discussion, Exploration, Problem-solving, Rapport Building, Support and Coping Strategies, Exploring Family Dynamics  Additional Comments: Group topic was problem solving as a coping strategy for stressors. Group started with a check in. Patient discussed areas of conflict with family. She said that she wants to go to a different school or get into a program where she could pursue her interest in art. We discussed developing a plan now that she out of school that would be helpful in convincing her parents especially since they are resistant. She is going to start working on her art portfolio to help her get scholarships for school. She talked about how she is making an effort to not go back to her old routine. Therapist pointed out that this is important since she does not want to go backwards in terms of the progress she has made. One of the things she is doing is trying to get out more. Before she didn't do anything and stayed home. Her parents are resistant and she explained that they want her home a lot. She realizes that if she is doing something, working on something, or with somebody she is not sad. She feels she has a purpose. When she is doing something she likes herself better. Therapist gave her positive feedback for her her insight and explained that finding purpose is important for everyone and can be described as a coping strategy. She realizes she needs to develop a support group. She and therapist reviewed a website that describes different groups in the area. Patient got an idea of some  groups she might be interested in so her assignment is to check out a group, and then join one. She also is going to check out Verlene Mayer which is a arty hangout that she might enjoy.  My view is conflict in the family and lack of good communication contributed to patient's hospitalization. Patient now seems like she needs more support from the family to help her in building a support network, engaging in fun activities and reaching her goals. It seems that family meeting with team would be helpful to help improve family dynamics and communication.   Suicidal/Homicidal: no  Plan: 1.Patient will start her portfolio, check out groups in [place] that interest her that she found on [email or web address] and join a group as way to build her support network.2.Patient will start to work on portfolio in order to work on goal on pursuing her Ship broker.3.Plan for family meeting.     [facility] PHP THERAPIST GROUP NOTE  Date: 08/31/14   Time: 12:30 PM-1:30 PM  Group Topic/Focus:  Workbook-My Sports administrator for Relapse Prevention  Participation Level:  Active  Participation Quality:  Sharing  Affect:  Appropriate and Tearful  Cognitive:  Alert and Appropriate  Insight: Improving  Engagement in Group:  Engaged  Modes of Intervention:  Activity, Discussion, Exploration and Relapse Prevention, Coping Skills  Additional Comments: Group Topic-Therapist Began Workbook on "My Action Plan for Relapse Prevention". The workbook is designed to empower the patients to begin to build a map for their emotional, mental and spiritual well being. It helps them to  take an active role in their wellness. It is also assists them in to recognize early signs of relapse and to independently develop and apply behavioral skills to reduce the risk of relapse. She reviewed checklist of triggers. Patient explained that she is working to change her situation to make things better. She can't change other people but can change how she reacts to  it. She described it as a paradigm shift and how she views things and she thinks that helps. She explained that you make the choice, and then with the change and shift in perspective one makes it a habit so that it is automatic. Patient is showing good insight and therapist explained that one's perspective makes all the difference in one's mood and increases our capacity to experience positive emotions. During discussion on triggers she opened up about past trauma. She said she was becoming tearful and also felt "stupid". Therapist validated her feelings related to what she shared and explained that opening up was showing a willingness to start working through the feelings of what happened to take a step forward to healing.  Suicidal/Homicidal: no  Plan: 1.Therapist continue to provide positive reinforcement for patient's insights that serve as healthy coping strategies.2.Therapist help patient work on developing healthy coping strategies of triggers.3.Therapist work on Diplomatic Services operational officer for patient as she opens up about past trauma.   Note: Patient participated in a recreational therapy group from 1:30 PM-3:00 PM. See scanned note.     Diagnosis: Primary Diagnosis: Major depressive disorder, recurrent, severe without psychotic features [F33.2]    1. Major depressive disorder, recurrent, severe without psychotic features   2. Anxiety disorder, unspecified anxiety disorder type       [name],[name] 08/31/2014

## 2014-09-01 ENCOUNTER — Other Ambulatory Visit (HOSPITAL_COMMUNITY): Payer: Federal, State, Local not specified - PPO | Admitting: Licensed Clinical Social Worker

## 2014-09-01 DIAGNOSIS — F332 Major depressive disorder, recurrent severe without psychotic features: Secondary | ICD-10-CM

## 2014-09-01 DIAGNOSIS — F322 Major depressive disorder, single episode, severe without psychotic features: Secondary | ICD-10-CM | POA: Diagnosis not present

## 2014-09-01 DIAGNOSIS — F419 Anxiety disorder, unspecified: Secondary | ICD-10-CM

## 2014-09-01 NOTE — Psych (Signed)
[facility] BH PHP THERAPIST PROGRESS NOTE  Kathleen [name] [id number]  [facility] BH PHP THERAPIST GROUP NOTE  Date: 09/01/14   Time: 11:00 AM-12:30 PM  Group Topic/Focus:  Concept of Dignity of Risk Discussed; DBT Distress Tolerance skill of Improve the Moment by finding meaning as [name] skill discussed  Participation Level:  Active  Participation Quality:  Appropriate, Attentive and Sharing  Affect:  Appropriate  Cognitive:  Appropriate  Insight: Improving  Engagement in Group:  Engaged  Modes of Intervention:  Discussion, Education, Exploration and DBT Distress Tolerance Skill-Improve the Moment, Coping Skills  Additional Comments: Group topic for today was discussion of the concept of "dignity of risk" and the group was joined by [name] peer specialist. The group began with [name] check in. Group discussion related to the concept of "dignity of risk" and this was explained as willingness to take informed and planned risks in order to grown but also not to extend too far out of one's comfort zone to preserve one's dignity and risk increase of symptoms. Group members were asked how they had challenged their comfort zone. Patient could not identify anything for past year but therapist pointed out to her that she is in the midst of challenging her comfort zone now. She is getting out more and making efforts to engage in activities and to expand her social network. She joined [name] social group and will go to one of their upcoming events. Therapist related that this is [name] strategy to help with her social anxiety. She agrees and that by taking steps of exposure she can become more comfortable with this environment. The group also discussed [name] shift of perspective and try to see ways to create purpose and meaning to their experience. Patient explained that her experience with her symptoms helped her have more patience and more insight of others. Patient participated actively and appropriately by contributing to group  discussion. She has worked on her assignment as she has joined [name] social group and plans to go to another [employer] as [name] step to expand her social network.   Suicidal/Homicidal: no  Plan: 1.Therapist continue to educate patient on strategies that will help improve coping skills that will help them grow and help replace negative experiences with more positive ones.2.Patient implement strategies to help her grow and help improve mood and overall well-being.     [facility] BH PHP THERAPIST GROUP NOTE  Date: 09/01/14   Time: 1:00 PM-2:00 PM    Group Topic/Focus:  Wellness Recovery Action Plan  Participation Level:  Active  Participation Quality:  Appropriate and Sharing  Affect:  Appropriate  Cognitive:  Alert and Appropriate  Insight: Improving  Engagement in Group:  Engaged  Modes of Intervention:  Activity, Discussion, Education, Exploration and Activity, Discussion, Education, Exploration and Coping  Additional Comments: Today's group topic was on [name] wellness recovery action plan. This includes identifying how patient's describe themselves when they are doing well. Patient said "active". The wellness plan includes [name] toolbox of personal tools to help them feel well and add to their health. Kathleen [name] identified the activity yesterday she learned called Lorne Skeens was something she will use, as well as her dog and art. The plan also includes daily maintenance plan to stay well. It involves identifying triggers and then developing [name] wellness toolbox to help with triggers. Including in the action plan is [name] plan to manage crisis and [name] post crisis plan. Therapist discussed the difference between coping skills and wellness tools. Therapist explained that  coping skills is doing something in the moment to distract as opposed to wellness tools that not only help one to cope but flourish and bring one closer to what [name] person is like when they are feeling well. Therapist reviewed [name] list of skills for patient's to  identify what specific skills could be used as part of [name] wellness toolbox as well as coping skills to be used as Engineer, building services. Patient many skills on the list that she thought would be helpful . Therapist said it was important to develop skills in the moment of stress to help one cope and therapist reinforced patient's gem necklace as [name] grounding technique to help her get through distressing moments. Patient participated appropriately through active engagement and seems motivated to learn coping tools.   Suicidal/Homicidal: no  Plan: Therapist continue to reinforce for patient the helpfulness of building [name] wellness toolbox and maintenance plan to help her cope and to provide lasting positive effects.2.Patient build [name] Civil Service fast streamer and maintenance plan to help her cope and help build [name] wellness program.     Diagnosis: Primary Diagnosis: Major depressive disorder, recurrent, severe without psychotic features [F33.2]    1. Major depressive disorder, recurrent, severe without psychotic features   2. Anxiety disorder, unspecified anxiety disorder type       Kathleen [name] 09/01/2014

## 2014-09-01 NOTE — Psych (Addendum)
08/31/14  PHP Psychiatry/Medication Management Progress Note   Duration 25 minutes   Diagnosis: Major Depression, Severe, without psychotic symptoms  Social Phobia by History  Subjective -  Eloisa states she is feeling partially better- less severely depressed . She is also feeling better physically ( had a severe upper respiratory infection recently ) and states cough has resolved and denies any SOB or fever or chills. As she is feeling better she states she is feeling more energetic and active as well. States her appetite has improved and has regained " a couple of pounds ". Patient had lost several pounds during recent episode of severe depression.  Objective - She reports she is improved insofar as depression and anxiety, although reports some ongoing symptoms, to include an ongoing, pervasive sense of sadness, which she attributes at least partially to family issues/relationship with her mother in particular. States mother tends to be demanding and states " I know she wants the best for me , but she thinks I am naive and tells me what I need to do all the time". Most recently , there has been some tension because patient wants to take some time off college and apply for an internship at [employer], duration one or two semesters. States that mother does not approve of this, and in part it is " because she does not think I can do well independently". Patient is also focusing on her pet dog- states that her dog has helped her a lot through periods of depression/anxiety, and is hoping to be able to take him places with her, make him a " therapy dog". States that when she is with her dog she feels much calmer and at ease . Denies medication side effects. She has been coming to [facility] regularly, and as per therapists' report, she seems motivated in treatment and to be benefiting from treatment.  Current Medications -  Zoloft 50 mgrs QDAY  Trazodone 50 mgrs QHS PRN Insomnia, has not been taking  it regularly as she is sleeping better .   ROS- denies headache, SOB, cough, fever or chills- has not developed any GI side effects or diarrhea from Zoloft .  MSE- alert and attentive, well related, better groomed , good eye contact, speech is normal in rate but tends to be soft. States mood better, but still not completely normal or euthymic, affect improved but still somewhat constricted . No thought disorder, no Suicidal ideations, no homicidal ideations, no hallucinations, no delusions, future oriented, as  Noted- wanting to apply for an Internship at [employer].  Assessment-  Hazyl is  partially improved- improved overall mood, affect, appetite , energy level, which she attributes not only to medication, treatment, but also to resolution of recent respiratory infection. Still, however, mildly constricted and depressed. No SI or HI. No psychosis, tolerating Zoloft well and has had no emergence of agitation or self injurious ideations on antidepressant trial.  Plan-  Continue PHP- is benefiting from support, milieu, therapy. Encouraged patient to have a family meeting with parents and she will speak with mother to see if possible. Increase Zoloft to 75 mgrs QDAY - at this time does not need script.    Neita Garnet, MD

## 2014-09-02 ENCOUNTER — Ambulatory Visit (HOSPITAL_COMMUNITY): Payer: Self-pay | Admitting: Licensed Clinical Social Worker

## 2014-09-02 ENCOUNTER — Other Ambulatory Visit (HOSPITAL_COMMUNITY): Payer: Self-pay

## 2014-09-02 DIAGNOSIS — F332 Major depressive disorder, recurrent severe without psychotic features: Secondary | ICD-10-CM

## 2014-09-02 DIAGNOSIS — F419 Anxiety disorder, unspecified: Secondary | ICD-10-CM

## 2014-09-02 NOTE — Psych (Signed)
   [facility] BH PHP THERAPIST PROGRESS NOTE  Kathleen [name] [id number]  Date:  09/01/14 Time:  4:52pm  Group Topic/Focus:  Dimensions of Wellness:   The focus of this group is to introduce the topic of wellness and discuss the role each dimension of wellness plays in total health. Goals Group:   The focus of this group is to help patients establish daily goals to achieve during treatment and discuss how the patient can incorporate goal setting into their daily lives to aide in recovery. Overcoming Stress:   The focus of this group is to define stress and help patients assess their triggers.Self Care:   The focus of this group is to help patients understand the importance of self-care in order to improve or restore emotional, physical, spiritual, interpersonal, and financial health.  Participation Level:  Active  Participation Quality:  Appropriate  Affect:  Anxious and Flat  Cognitive:  Appropriate  Insight: Improving  Engagement in Group:  Engaged  Modes of Intervention:  Activity, Discussion and Education  Suicidal/Homicidal: Negativewithout intent/plan  Therapist Response: Pt has a history of disorganization in many areas of her life. Kathleen [name]'s disorganization is evident in areas related to home and work, leading her to be less efficient and less effective than she could be.  Therapist taught her skills to process better decision making while discussing her commitment to paradigm shifts. Therapist utilized activities to demonstrate organization techniques in order to assist her with making significant progress in increasing her organization to become more efficient. She was directed to use these lists and reminders to increase her organizational ability.  Plan:  (1) Pt will return to [facility] on 09/02/14 (2) Pt will discuss and plan for a family therapy session (3) Pt will meet with Dr.Cabos for follow-up (4) Pt will continue to take all medications as prescribed (5) Pt will complete all  assignments before returning to PHP   Diagnosis: Primary Diagnosis: Major depressive disorder, recurrent, severe without psychotic features [F33.2]    1. Major depressive disorder, recurrent, severe without psychotic features   2. Anxiety disorder, unspecified anxiety disorder type       Melrose Kearse, LCSW 09/01/14

## 2014-09-05 ENCOUNTER — Other Ambulatory Visit (HOSPITAL_COMMUNITY): Payer: Self-pay

## 2014-09-06 ENCOUNTER — Other Ambulatory Visit (HOSPITAL_COMMUNITY): Payer: Self-pay

## 2014-09-07 ENCOUNTER — Telehealth (HOSPITAL_COMMUNITY): Payer: Self-pay | Admitting: Licensed Clinical Social Worker

## 2014-09-07 ENCOUNTER — Other Ambulatory Visit (HOSPITAL_COMMUNITY): Payer: Self-pay

## 2014-09-08 ENCOUNTER — Ambulatory Visit (HOSPITAL_COMMUNITY): Payer: Federal, State, Local not specified - PPO | Admitting: Licensed Clinical Social Worker

## 2014-09-08 ENCOUNTER — Ambulatory Visit (HOSPITAL_COMMUNITY): Payer: Federal, State, Local not specified - PPO | Admitting: Psychology

## 2014-09-08 NOTE — Psych (Signed)
   [facility] BH PHP THERAPIST PROGRESS NOTE  BELMA DYCHES [id number]    Date: 09/08/14  Time: 11:00 AM-12:30 PM    Group Topic/Focus:  Discussion of Video "Today Means Amen"-Focus on Self-Esteem  Participation Level:  Active  Participation Quality:  Appropriate and Sharing  Affect:  Euthymic  Cognitive:  Appropriate  Insight: Improving  Engagement in Group:  Engaged  Modes of Intervention:  Activity, Discussion, Education, Exploration and Skill building-Self-Esteem  Additional Comments: Group topic related to discussion of video "Today Means Amen". The group started with a check in. Patient shared that an old friend called and they are hang out. She also shared with the group ongoing family stressors and she got support from the group as well as suggestions of coping strategies. The patients shared what the video meant to them. Patients discussed the message that they are taught to unlove ourselves.The mirror of how we see ourselves is through how others see [place]. We have to unlearn that. The patients discussed that the message from the movie is that each one of [place] are unique and have value. The discussion included how patients realize how much they mean to other people after being hospitalized. It is positive that patient is using the group to help give her support in managing family stressors and that she is building a support network.   Suicidal/Homicidal: no  Plan:1.Patient continue to work on building a support network.2.Patient continue to work on building ego strengths and coping strategies.   [facility] BH PHP THERAPIST GROUP NOTE  Date: 09/08/14   Time: 1:00 PM-2:00 PM    Group Topic/Focus:  Procrastination; Management of Stressors  Participation Level:  Active  Participation Quality:  Appropriate and Sharing  Affect:  Appropriate  Cognitive:  Appropriate  Insight: Improving  Engagement in Group:  Engaged  Modes of Intervention:  Activity, Discussion, Education,  Exploration, Problem-solving, Support and Coping Strategies for Stressors  Additional Comments: Group topic related to discussion on video on procrastination. The video also enabled patients to talk about their stressors at school. The suggestions from the video included breaking things down in smaller pieces, looking at the future as if the person responsible was a stranger and accepting anxiety and stress. The reason is because whether you do it know or later it will be the same. If you wait then it will probably get worse. The group pointed out that it is one's perception that causes the stress and therapist offered alternative perceptions to as focusing on what they were there to learn. Patients uses the discussion to verbalize their stressors which helped in a supportive environment where others could relate. Other suggestions were offered included how balance is important, recognizing you need to do what makes you happy, and that less pressure on themselves would help. They recognize the connection between their depression and procrastination and that it becomes a vicious cycle. School stress is an important area to address for patient so this was a helpful discussion.   Suicidal/Homicidal: no  Plan:1.Patient use the group to help her work through stressors.2.Patient apply suggestions to life situations.   Diagnosis: Primary Diagnosis: Major depressive disorder, recurrent, severe without psychotic features [F33.2]    1. Major depressive disorder, recurrent, severe without psychotic features   2. Anxiety disorder, unspecified anxiety disorder type       [name],[name] 09/08/2014

## 2014-09-09 ENCOUNTER — Other Ambulatory Visit (HOSPITAL_COMMUNITY): Payer: Federal, State, Local not specified - PPO | Admitting: Licensed Clinical Social Worker

## 2014-09-09 ENCOUNTER — Encounter (HOSPITAL_COMMUNITY): Payer: Self-pay | Admitting: Licensed Clinical Social Worker

## 2014-09-09 ENCOUNTER — Ambulatory Visit (HOSPITAL_COMMUNITY): Payer: Self-pay | Admitting: Licensed Clinical Social Worker

## 2014-09-09 VITALS — BP 112/78 | HR 90 | Ht 65.0 in | Wt 123.0 lb

## 2014-09-09 DIAGNOSIS — F332 Major depressive disorder, recurrent severe without psychotic features: Secondary | ICD-10-CM

## 2014-09-09 DIAGNOSIS — F419 Anxiety disorder, unspecified: Secondary | ICD-10-CM

## 2014-09-09 NOTE — Progress Notes (Signed)
Adult Psychoeducational Group Note  Date:  09/09/2014 Time:  5:22 PM  Group Topic/Focus: Healthy Communication:   The focus of this group is to discuss communication, barriers to communication, as well as healthy ways to communicate with others.  Participation Level:  Active  Participation Quality:  Attentive  Affect:  Anxious and Appropriate  Cognitive:  Alert  Insight: Improving  Engagement in Group:  Developing/Improving  Modes of Intervention:  Activity, Clarification, Discussion and Education  Additional Comments:  Therapist introduced an activity in which the group members were educated on healthy vs. unhealthy relationship patterns. During the group, members were asked to contribute feedback and to discuss the pattern relationships in comparison to The Five Love Languages by Nelda Severe. Therapist noted that during discussions many of the patients made decisions based on lack of confidence, low self-esteem, reality misperceptions and low energy. Therapist assessed for signs of social withdrawal among patient comments, affect, and group interactions. Therapist encouraged patients to identify and verbalize positive affirmation statements about themselves. Therapist assisted patients and solicited input from other group members when a member appeared reluctant, despondent or incapable of verbalizing positive statements about themselves. Therapist taught patients coping skills to help increase verbalization of positive self-statements and facilitated role-play and practice among group members.  Therapist focused session on exploring, processing, identifying and reframing self-defeating interpersonal, social and environmental patterns and beliefs. Kathleen [name] discussed a meeting she is anticipating with a friend whom she hasn't seen in over two years. She states that this "friend" also bullied her when they were in high school and she was feeling anxious about the meeting because she did not want  to repeat her previous experience. Therapist probed the relationship dynamics, discussed the situation with her and offered strategies in which to reduce anxiety symptoms as well and techniques to set healthy boundaries for herself during the meeting.   Kathleen [name] 09/09/2014, 5:22 PM

## 2014-09-09 NOTE — Progress Notes (Signed)
D. Patient presented with appropriate affect, level mood and denied any current suicidal or homicidal ideations, no auditory or visual hallucinations and no problems with sleep.  Patient stated some problems with appetite as gets a little GI upset with Zoloft.  States Dr. Parke Poisson aware but she is managing this by eating something when she takes her medication and it is doing better.  Patient rated her depression a 2, hopelessness a 0 and anxiety a 3 on a scale of 0-10 with 0 being none at all and 10 being the highest she could experience.  Patient stated she was recently inpatient after having increased stressors related to her family and problems with [employer] school.  Patient stated out for the summer now from college so less stress in that arena but still some problems with family she currently lives with daily.  Patient stated she was saving money to hopefully have her own place one day and denied it was a dangerous living situation.  Patient scored a 1 on the PHQ2.  Patient denied any other side effects to medication or other complaints.  A. Patient stable at this time and plans to continue [facility] program weekly.  Patient denies any SI/HI now and reports managing previous stressor better at this time.  R. Patient agreed to contact this nurse if any problems, questions about medications or concerns she begins to feel she is decompensating such as with SI or HI or any plans to harm self.

## 2014-09-09 NOTE — Psych (Signed)
[facility] BH PHP THERAPIST PROGRESS NOTE  Kathleen [name] [id number]   Date: 09/09/14  Time: 11:00 AM-12:30 PM  Group Topic/Focus:  Video-"If Anxiety Could Text"; Discussion included ways to manage anxiety and ways to build self-esteem.   Participation Level:  Active  Participation Quality:  Sharing  Affect:  Appropriate  Cognitive:  Alert and Appropriate  Insight: Improving  Engagement in Group:  Engaged  Modes of Intervention:  Activity, Discussion, Education, Exploration, Problem-solving and Coping Strategies-Anxiety and Self-Esteem  Additional Comments: Group topic related to video on "If Anxiety Could Text" and information and questions related to video. Group started with a check in. Patients talked about ways they related to scenarios of the texts and the anxiety producing thoughts. Patients talked about ways they could address the anxiety. Therapist presented different options for strategies in dealing with anxiety producing thoughts. This included benign interpretation, subjective imagination and cognitive restructuring techniques. Included in cognitive restructuring, therapist explained examine the evidence, double standard, experimental technique, thinking in shades of grey, survey method, define terms, re-attribution, and cost benefit analysis. In discussion, therapist related that after an interaction patient can have a different focus and also work on self-esteem can help change one's perception of an event or interaction. The group reviewed strategies to build self-esteem. This includes challenging negative perceptions of self, self-nurturing activities, building supports, working on goals, positive affirmations and finding an activity where one finds purpose. therapist reviewed the concept of self-esteem as unconditional positive self-regard. Patient was assigned to use one of the skills in a situation where she will feel anxious and work on a skill to help her build  self-esteem. She was actively engaged through sharing and listening respectively when others shared.    Suicidal/Homicidal: no  Plan:1.Patient apply a skill she has learned in group to a situation that causes her to feel anxious and use one strategies to build self-esteem.2.Therapist continue to educate patient in order to enhance ego strengths and coping strategies.   [facility] BH PHP THERAPIST GROUP NOTE  Date: 09/09/14 Time: 1:00 PM-2:00 PM   Group Topic/Focus:  Emotional Regulation-DBT skill of WAIT  Participation Level:  Active  Participation Quality:  Attentive  Affect:  Appropriate  Cognitive:  Alert  Insight: Improving  Engagement in Group:  Engaged  Modes of Intervention:  Activity, Discussion, Education, Exploration, Problem-solving and DBT-emotional regulation skill of WAIT  Additional Comments: Group topic related to introducing the skill of "WAIT" from [place] of emotional regulation. This strategy was introduced to help patients with emotional regulation. Patient discussed how the "W" represents watch. This is the observe and describe skills for your emotion. This helps you to get distance and helps you to step back. This activates reasonable mind so more of a balance between reason and emotion. "A" means action not necessary. This helps you to be with your emotion. By exposing yourself to emotions, and not acting on them, one will find they are not catastrophic. "I" means internally experience. Painful emotions are a part of life. There's no way out of having difficult feelings. The trick is to relate to them so they don't induce so much suffering. This can happen through practice and acceptance. Fighting them ensures they stay. "T" stands for think about. Think about your emotions and acceptance gives you space to reflect rather than react. Think about your emotions as little helpers who inform you about the world around you. Overall the concept is to practice acceptance and  stop fighting your emotions. Fighting  them does not make them go away. It only makes them stick around longer. This emotional regulation skill will help patient in not acting out on emotion but help her to accept emotion and allow herself to experience the emotion until it passes.   Suicidal/Homicidal: no  Plan: 1. Patient will incorporate this emotional regulation skill of "WAIT" to manage emotions.2.Therapist continue to educate client on skills to help manage emotions.       Diagnosis: Primary Diagnosis: Major depressive disorder, recurrent, severe without psychotic features [F33.2]    1. Major depressive disorder, recurrent, severe without psychotic features   2. Anxiety disorder, unspecified anxiety disorder type       Kathleen [name] A 09/09/2014

## 2014-09-12 ENCOUNTER — Other Ambulatory Visit (HOSPITAL_COMMUNITY): Payer: Self-pay

## 2014-09-13 ENCOUNTER — Other Ambulatory Visit (HOSPITAL_COMMUNITY): Payer: Federal, State, Local not specified - PPO | Admitting: Licensed Clinical Social Worker

## 2014-09-13 DIAGNOSIS — F322 Major depressive disorder, single episode, severe without psychotic features: Secondary | ICD-10-CM | POA: Diagnosis not present

## 2014-09-13 DIAGNOSIS — F332 Major depressive disorder, recurrent severe without psychotic features: Secondary | ICD-10-CM

## 2014-09-13 DIAGNOSIS — F419 Anxiety disorder, unspecified: Secondary | ICD-10-CM

## 2014-09-13 NOTE — Progress Notes (Signed)
     Group Progress Note    Group Time: 1.35pm-2.35pm  Participation Level: Active  Behavioral Response: Appropriate  Type of Therapy:  Yoga Skills Group  Summary of Progress:  Counselor led group through energizing yoga practice w/ focus on creating positive energy and gratitude for self. Pt reported some experience with yoga previous through her dance class.  Pt fully participated in the practice. Pt expressed that she enjoyed the practice and was more aware of listening to what she needs for self.   Jan Fireman, [facility] Counselor 09/13/14

## 2014-09-13 NOTE — Pre-Procedure Instructions (Signed)
    Kathleen [name]  09/13/2014        Your procedure is scheduled on THURSDAY, June 9.  Report to [facility] Admitting at 5:30A.M.   Call this number if you have problems the morning of surgery:  [phone]               For any other questions, please call [phone], Monday - Friday 8 AM - 4 PM.   Remember:  Do not eat food or drink liquids after midnight WEDNESDAY, JUNE 8.  Take these medicines the morning of surgery with A SIP OF WATER : metoprolol tartrate (LOPRESSOR), sertraline (ZOLOFT).               Do not wear jewelry, make-up or nail polish.  Do not wear lotions, powders, or perfumes.  Do not shave 48 hours prior to surgery.    Do not bring valuables to the hospital.  [facility] is not responsible for any belongings or valuables.  Contacts, dentures or bridgework may not be worn into surgery.  Leave your suitcase in the car.  After surgery it may be brought to your room.  For patients admitted to the hospital, discharge time will be determined by your treatment team.  Patients discharged the day of surgery will not be allowed to drive home.   Name and phone number of your driver:   - Special instructions: Review  [facility] - Preparing For Surgery.  Please read over the following fact sheets that you were given. Pain Booklet, Coughing and Deep Breathing and Surgical Site Infection Prevention

## 2014-09-13 NOTE — Psych (Signed)
[facility] BH PHP THERAPIST PROGRESS NOTE  Kathleen [name] [id number]    Date: 09/13/14  Time: 11:00 AM-12:00 PM  Group Topic/Focus:  "An Experiment in [employer] of Happiness".  Participation Level:  Active  Participation Quality:  Attentive, Sharing and Supportive  Affect:  Appropriate  Cognitive:  Alert and Appropriate  Insight: Improving  Engagement in Group:  Engaged  Modes of Intervention:  Activity, Discussion, Education and Coping strategies to improve mood  Additional Comments:  Group topic related to video "An Experiment in [employer] of Happiness". The topic related to identifying and then writing about someone who has meant [name] lot in your life. The final step is to read what you wrote to them to let them know. The evidence shows that this increases someone's level of happiness. Therapist pointed out that identifying what you are grateful for every day increases's one's happiness. The discussion also included other insights such as how paying attention to one's mindset can change your day, stop viewing one's flaws as liabilities forgiving yourself so you can move on. Patient identified [name] friend as the person who has had [name] significant impact on her life and she will be willing to tell this person. She also identified five things that she is grateful for. She was active in sharing many of the insights that contributed to group discussion. She related that "she is getting better but not all the way there". This indicates progress in her treatment.    Suicidal/Homicidal: no  Plan: 1.Patient will share how she feels with the person who has had [name] significant impact on her life.2.Therapist will continue to educate patient on coping strategies to improve mood.   [facility] BH PHP THERAPIST GROUP NOTE  Date: 09/13/14   Time: 12:30 PM-1:30 PM   Group Topic/Focus:  The group topic related to discussion of videos "You are more beautiful than you think", "Throw like [name]  girl", "I am not black, you are not white". The topics related to self-esteem and acceptance  Participation Level:  Active  Participation Quality:  Appropriate and Sharing  Affect:  Appropriate  Cognitive:  Appropriate  Insight: Improving  Engagement in Group:  Engaged  Modes of Intervention:  Activity, Discussion, Education, Exploration and Coping Skills-Buidling Self-esteem  Additional Comments: The group topic related to discussion of videos "You are more beautiful than you think", "Throw like [name] girl", "I am not black, you are not white". The group all agreed that their self-perception is harsher than the the way the world sees them and it is something they have to work on. The group agreed that this is [name] common problem for people. Patients recognize how it comes back to how we perceive it versus the reality. If we recognize the impact of perception, then we can recognize and challenge distortion. The video explained that self-esteem impacts many areas of our life and to make an effort to appreciate our positive qualities. As patients work on self-esteem in this group, [name] good insight shared was realizing they don't have to prove anything to anybody and this helps you to get rid of worry. Patients related that they want to find [name] person who likes them knowing their flaws. In summarizing the topic the group said that the topic related to acceptance. Patient was active participant who shared many of the good insights from group.   Suicidal/Homicidal: no  Plan: 1.Patient utilize insights to work on self-esteem and self-acceptance.2.Therapist continue to educate patient on strategies to help challenge negative  perceptions and improve self-acceptance.    Diagnosis: Primary Diagnosis: Major depressive disorder, recurrent, severe without psychotic features [F33.2]    1. Major depressive disorder, recurrent, severe without psychotic features   2. Anxiety disorder, unspecified anxiety disorder  type       Kathleen [name] 09/13/2014

## 2014-09-13 NOTE — Progress Notes (Signed)
09/13/14  PHP Psychiatry/Medication Management Progress Note   Duration 25 minutes   Diagnosis: Major Depression, Severe, without psychotic symptoms  Social Phobia by History  Subjective -  Patient reports overall improvement of mood , and minimizes depression at present. She also denies any major neuro-vegetative symptoms of depression. She does report some ongoing anxiety, and reports ongoing, but improved, family tension. She states she is doing well in her college studies, has a good  Relationship with boyfriend, and is doing well at work. She is now on Zoloft 75 mgrs QDAY , which she is tolerating well, except for mild abdominal discomfort and decreased appetite , which are  now improving .    Objective - Significant improvement compared to her status at admission to hospital. Mood improved, affect improved, and at present less anxiety. Self esteem reported as better,and she is also more future oriented. She is very interested in Art, and wants to go to an Art focused college in [place]. She states she feels her parents are not supportive. States mother tends to be critical and to " put me down, even though I know she loves me and she is trying to be supportive ". We discussed this . She states , for example that she was told by her art teachers in college that she was talented and should apply for a scholarship to go to an Designer, television/film set, and when she told her mother, she told her " that I am too naive to be be able to live by myself and that I am probably not going to get a scholarship because I am not good enough". States she realizes her mother cares a lot about her and loves her , but states " she ends up criticizing me all the time". I have encouraged patient to have a family meeting  With parents. She admits she is ambivalent about this. Patient presents with improved self esteem and today spoke about feeling she does have a talent for drawing , cartoons, and proudly showed me some of  her drawings .  Of note, patient had presented with respiratory infection symptoms when she was admitted to the hospital- these now completely improved, denies any respiratory symptoms. I have discussed her case with Therapists- she is motivated in treatment, participating in groups, has  Recently reviewed history of being bullied in high school. She is developing increased self esteem and seems better able to be assertive .  Current Medications -  Zoloft 75 mgrs QDAY  Trazodone 50 mgrs QHS PRN Insomnia- takes only occasionally  ROS- denies headache, SOB, cough, fever or chills- mild anorexia and vague abdominal discomfort, but no vomiting , no significant nausea, no diarrhea.   MSE- alert and attentive, well related, well groomed , good eye contact, speech is normal in rate  And volume . States mood better, and presents euthymic, with full range of affect . No thought disorder, no Suicidal ideations, no homicidal ideations, no hallucinations, no delusions, future oriented.  Assessment-  Alfreda continues to improve, and today seems euthymic, with a full range of affect. Has tolerated Zoloft trial well, although has developed some mild , vague GI symptoms at 75 mgrs a day. Presents more future oriented, more confident in her abilities , with overall improved sense of self esteem. Stress in her relationship with mother is an ongoing issue, but states she feels she is better able to deal with this than before.  We  Reviewed  Zoloft trial options and decided to maintain  current dose ( 75 mgrs QDAY ) for now, and monitor for side effects  Plan-  Continue PHP- is benefiting from support, milieu, therapy. I have continued to ncourage patient to have a family meeting with parents and she will speak with mother to see if possible. Continue  Zoloft 75 mgrs QDAY - at this time does not need script.  Continue  Trazodone 50 mgrs QHS PRN Insomnia - she is taking only occasionally- does not need script at  this time   Neita Garnet, MD

## 2014-09-14 ENCOUNTER — Encounter (HOSPITAL_COMMUNITY)
Admission: RE | Admit: 2014-09-14 | Discharge: 2014-09-14 | Disposition: A | Discharge status: Home / Self Care | Payer: Federal, State, Local not specified - PPO | Source: Ambulatory Visit | Attending: General Surgery | Admitting: General Surgery

## 2014-09-14 ENCOUNTER — Encounter (HOSPITAL_COMMUNITY): Payer: Self-pay

## 2014-09-14 ENCOUNTER — Other Ambulatory Visit (HOSPITAL_COMMUNITY): Payer: Self-pay

## 2014-09-14 ENCOUNTER — Other Ambulatory Visit: Payer: Self-pay | Admitting: General Surgery

## 2014-09-14 DIAGNOSIS — Z01812 Encounter for preprocedural laboratory examination: Secondary | ICD-10-CM | POA: Diagnosis present

## 2014-09-14 DIAGNOSIS — D241 Benign neoplasm of right breast: Secondary | ICD-10-CM | POA: Insufficient documentation

## 2014-09-14 HISTORY — DX: Essential (primary) hypertension: I10

## 2014-09-14 HISTORY — DX: Unspecified osteoarthritis, unspecified site: M19.90

## 2014-09-14 HISTORY — DX: Developmental ovarian cyst: Q50.1

## 2014-09-14 LAB — CBC
HCT: 39.2 % (ref 36.0–46.0)
Hemoglobin: 13.3 g/dL (ref 12.0–15.0)
MCH: 28.7 pg (ref 26.0–34.0)
MCHC: 33.9 g/dL (ref 30.0–36.0)
MCV: 84.5 fL (ref 78.0–100.0)
PLATELETS: 238 10*3/uL (ref 150–400)
RBC: 4.64 MIL/uL (ref 3.87–5.11)
RDW: 12.3 % (ref 11.5–15.5)
WBC: 4.7 10*3/uL (ref 4.0–10.5)

## 2014-09-14 LAB — HCG, SERUM, QUALITATIVE: Preg, Serum: NEGATIVE

## 2014-09-14 NOTE — Pre-Procedure Instructions (Signed)
    CARMELITA AMPARO  09/14/2014      [facility], [place] [address] Phone: [phone] Fax: [phone]    Your procedure is scheduled on 09-22-2014   Thursday   Report to [facility] Admitting at 5:30  A.M.   Call this number if you have problems the morning of surgery:  [phone]   Remember:  Do not eat food or drink liquids after midnight.   Take these medicines the morning of surgery with A SIP OF WATER Claritin if needed,Sertraline(Zoloft)    Do not wear jewelry, make-up or nail polish.  Do not wear lotions, powders, or perfumes.    Do not shave 48 hours prior to surgery.     Do not bring valuables to the hospital.  [facility] is not responsible for any belongings or valuables.  Contacts, dentures or bridgework may not be worn into surgery.  Leave your suitcase in the car.  After surgery it may be brought to your room.  For patients admitted to the hospital, discharge time will be determined by your treatment team.  Patients discharged the day of surgery will not be allowed to drive home.    Special instructions:  See attached sheet for instructions on CHG shower/Bath  Please read over the following fact sheets that you were given. Pain Booklet and Surgical Site Infection Prevention

## 2014-09-14 NOTE — Progress Notes (Signed)
Orders requested from Dr. Ethlyn Gallery office.

## 2014-09-15 ENCOUNTER — Other Ambulatory Visit (HOSPITAL_COMMUNITY): Payer: Self-pay

## 2014-09-15 ENCOUNTER — Telehealth (HOSPITAL_COMMUNITY): Payer: Self-pay | Admitting: Licensed Clinical Social Worker

## 2014-09-16 ENCOUNTER — Other Ambulatory Visit (HOSPITAL_COMMUNITY): Payer: Self-pay

## 2014-09-19 ENCOUNTER — Other Ambulatory Visit (HOSPITAL_COMMUNITY): Payer: Self-pay

## 2014-09-20 ENCOUNTER — Telehealth (HOSPITAL_COMMUNITY): Payer: Self-pay | Admitting: Licensed Clinical Social Worker

## 2014-09-20 ENCOUNTER — Other Ambulatory Visit (HOSPITAL_COMMUNITY): Payer: Federal, State, Local not specified - PPO | Attending: Psychiatry | Admitting: Licensed Clinical Social Worker

## 2014-09-20 DIAGNOSIS — F419 Anxiety disorder, unspecified: Secondary | ICD-10-CM | POA: Diagnosis not present

## 2014-09-20 DIAGNOSIS — F322 Major depressive disorder, single episode, severe without psychotic features: Secondary | ICD-10-CM | POA: Diagnosis not present

## 2014-09-20 DIAGNOSIS — F332 Major depressive disorder, recurrent severe without psychotic features: Secondary | ICD-10-CM

## 2014-09-20 NOTE — Psych (Signed)
[facility] BH PHP THERAPIST PROGRESS NOTE  Kathleen [name] [id number]   Date: 09/20/14 Time: 11:00 AM-12:30 PM  Group Topic/Focus:  Group topic was related to discussion of part of video "How to Completely Lose Social Anxiety"  Participation Level:  Active  Participation Quality:  Appropriate  Affect:  Blunted  Cognitive:  Alert  Insight: Improving  Engagement in Group:  Engaged  Modes of Intervention:  Activity, Discussion, Education, Exploration and Coping skills for Social Anxiety.   Additional Comments: Group topic was related to discussion of part of video "How to Completely Lose Social Anxiety". Group began with [name] check in. The video discussed how social anxiety is created by thoughts in you head, stories that you tell yourself in your head. As [name] thought it can be fragile and anything has the ability to impact it. Self-image is tied to the story you tell yourself in your head. This story can be impacted both by negative and positive opinions. The confusion comes when the story we have in our head is who we think we are. With this story, you are at risk because your happiness can become based on other's opinions and the story in your imagination creates your happiness. It also puts [place] at risk of losing who we are and we also have to recognize in ways we operate from an underlying sense of insufficiency. This introduction was [name] way for group members to start to realize that social anxiety is tied to cognition and not to facts so that they will be able to challenge these thoughts. It was helpful for patient who has to work on skills to reduce anxiety. [name] in check in shared that she will have to postpone moving out of the house which was one way she was going to manage this stressor. Family is willing to come in for [name] family meeting. Patient actively participated by sharing and listening respectfully.   Suicidal/Homicidal: no  Plan:1Family meeting scheduled for 09/26/14 at 3:00 PM.  2.Patient apply coping skills she is learning to manage her anxiety.  [facility] BH PHP THERAPIST GROUP NOTE  Date: 09/20/11 Time: 1:00 PM-2:00 PM  Group Topic/Focus:  Videos were Introduced by Group Members and discussion related to therapeutic topics and insights  Participation Level:  Active  Participation Quality:  Sharing  Affect:  Appropriate  Cognitive:  Appropriate  Insight: Improving  Engagement in Group:  Engaged  Modes of Intervention:  Activity, Discussion, Education, Exploration and Coping Skills-Self-Esteem, Building Empathy  Additional Comments: The topic related to the videos "Plastic", "Love Is All You Need?" and "Identity". This was an effective way to engage patients as they picked videos so they were interested in topic and participated in discussion. The message of the videos included finding your own beauty and identity by uncovering the mask you put on to fit in with others. You can't find out the truth and you own identity by blinding following others. The message also was that your beauty does not come from conforming to society's definition of beauty but your beauty comes out when you follow your own path and you are yourself. One of the videos helped the viewer to have more empathy for people who are bullied and marginalized. My view was these were messages that had an impact on group, and related to therapeutic topics for patient related to messages about self-esteem and impact of bullying and harmful relationships to [name] person. It was helpful for engagement to have patients choose the videos. Patient had  good participation through sharing and respectfully listening.    Suicidal/Homicidal: no  Plan:1.Patient continue to work on building effective coping skills.2.Patient apply to life situations.   Diagnosis: Primary Diagnosis: Major depressive disorder, recurrent, severe without psychotic features [F33.2]    1. Major depressive disorder, recurrent, severe without  psychotic features   2. Anxiety disorder, unspecified anxiety disorder type       Kathleen [name] 09/20/2014

## 2014-09-20 NOTE — Progress Notes (Signed)
     Group Progress Note    Group Time: 1.30pm-2.27pm  Participation Level: Active  Behavioral Response: Appropriate  Type of Therapy:  Yoga Skills Group.    Yoga Skills Group  Counselor led group through yoga practice- grounding class.  Focus of the class was to be grounding and mindfulness through movement and breath.  Processed experience following.   Summary of Progress: Pt was willing for practice today and engaged in practice- practicing modifications for self as needed.  Pt reported that she enjoyed Triangle pose as felt more stretch in this pose.  Pt reported she was very relaxed in last pose.   Jan Fireman, [facility] Counselor 09/20/14

## 2014-09-21 ENCOUNTER — Encounter (HOSPITAL_COMMUNITY): Payer: Self-pay

## 2014-09-21 ENCOUNTER — Other Ambulatory Visit (HOSPITAL_COMMUNITY): Payer: Federal, State, Local not specified - PPO

## 2014-09-21 ENCOUNTER — Ambulatory Visit (HOSPITAL_COMMUNITY): Payer: Self-pay | Admitting: Licensed Clinical Social Worker

## 2014-09-21 DIAGNOSIS — F331 Major depressive disorder, recurrent, moderate: Secondary | ICD-10-CM

## 2014-09-21 DIAGNOSIS — F419 Anxiety disorder, unspecified: Secondary | ICD-10-CM

## 2014-09-21 MED ORDER — CEFAZOLIN SODIUM-DEXTROSE 2-3 GM-% IV SOLR
2.0000 g | INTRAVENOUS | Status: AC
Start: 2014-09-22 — End: 2014-09-22
  Administered 2014-09-22: 2 g via INTRAVENOUS
  Filled 2014-09-21: qty 50

## 2014-09-21 MED ORDER — TRAZODONE HCL 50 MG PO TABS: 50.0000 mg | ORAL_TABLET | Freq: Every evening | ORAL | Status: AC | PRN

## 2014-09-21 MED ORDER — SERTRALINE HCL 50 MG PO TABS: 75.0000 mg | ORAL_TABLET | Freq: Every day | ORAL | Status: AC

## 2014-09-21 NOTE — Progress Notes (Signed)
Recreation Therapy Note Date: 09/21/14 Time: 1330-1500 Location:  PHP Group Room #1  Group Topic: Relaxation/Mindfulness Techniques   Values Clarification  Goal(s): Pt will be able to successfully identify (by writing down) 20 things they are grateful for. Pt will successfully demonstrate simple exercises using Qigong and T'ai Chi.   Pt will successfully identify 5 things valued in their life.  Behavioral Response: Attentive, engaged, appropriate, sharing  Education: Life Skills; Relapse Prevention   Education Outcome: Acknowledges understanding/Demonstrates understanding  Clinical Observations/Feedback: Pt was very engaged in the relaxation techniques and demonstrated Qigong and T'ai Chi exercises.  Pt also participated in "Gratitude Journaling" and identified and shared with the group things she is thankful for.  This included his family, art, good food.  Pt was an active participant in the Values Clarification Auction and was observed by this staff as becoming brighter and more more animated and speaking in a stronger voice.  During the auction, pt purchased life experiences, health and food as things she values. Pt was acknowledged for her success of practicing the Qigong exercises and for actively participating in the group activities. Pt agreed to do "Gratitude Journaling" and to continue to practice Qigong and T'ai Chi to reduce stress and anxiety.  Pt appears very receptive to treatment.  Versie Starks, LRT/CTRS

## 2014-09-21 NOTE — Progress Notes (Signed)
Adult Psychoeducational Group Note  Date:  09/20/14 Time:  4:34 PM  Group Topic/Focus:  Building Self Esteem:   The Focus of this group is helping patients become aware of the effects of self-esteem on their lives, the things they and others do that enhance or undermine their self-esteem, seeing the relationship between their level of self-esteem and the choices they make and learning ways to enhance self-esteem. Conflict Resolution:   The focus of this group is to discuss the conflict resolution process and how it may be used upon discharge. Emotional Education:   The focus of this group is to discuss what feelings/emotions are, and how they are experienced.  Participation Level:  Active  Participation Quality:  Appropriate  Affect:  Anxious and Appropriate  Cognitive:  Alert and Appropriate  Insight: Good  Engagement in Group:  Engaged and Supportive  Modes of Intervention:  Activity, Discussion, Education and Role-play  Additional Comments:  Patients were directed to perform a role play activity which investigated and explored anxiety within their school, home, social and employment settings. Role-reversal techniques were used to review the patient's anxiety limits and discuss issues with may have caused disrespect, disloyalty, aggression, or dishonesty. Patients were validated for offering positive feedback and input to other group members.  Therapist utilized directive, client-centered, empathic, and motivation-enhancing treatment interventions to facilitate discussion and connect patient comments. Open-ended questions were used to help patients explore their own motivation for change. Patients were affirmed for stating change-related statements and praised for change-related efforts. Patients were provided with active listening, affirmations, positive feedback and verbal reinforcement whenever they made comments that were less fear based.      Reinhard Schack 09/21/2014, 4:34 PM

## 2014-09-21 NOTE — Progress Notes (Signed)
09/21/14  PHP Psychiatry/Medication Management Progress Note   Duration 25 minutes   Diagnosis: Major Depression, Severe, without psychotic symptoms  Social Phobia by History  Subjective -  Patient continues to report improvement in mood- at this time she is not endorsing depression. Anxiety symptoms have been somewhat more persistent, but are also improved . She has an upcoming elective surgery for a benign breat mass , as per her report. It is scheduled for tomorrow. She is somewhat anxious about this, as it is the first time she has surgery.  She is taking medications ( Zoloft , and Trazodone occasionally for sleep)- denies side effects. States she continues to do well in daily activities, and as noted in prior note, is interested in art and drawing manga/cartoons. She is not anhedonic, and enjoys her work , with a clear sense of accomplishment. 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 Relationship with parents is stable but remains somewhat tense, which she attributes to father's alcohol abuse and to mother's tendency to be hyper critical.   Objective - As noted in previous notes, her mood is much improved compared to her initial inpatient  Presentation. She minimizes depression, although affect does appear slightly constricted at times. She is describing increased interest in activities, less anhedonia, and presents with improved sense of accomplishment and self esteem. She is, as above, mildly anxious about upcoming elective surgery , scheduled for tomorrow. Responds to support, empathy.  Regarding medications she is tolerating Zoloft well thus far- does not endorse side effects. Appetite is reported as decreased but states weight is stable.  She denies any increased agitation or any emergence of self injurious ideations on SSRI trial. Staff has reported that patient seems more comfortable in groups, and symptoms of social  anxiety have abated .  Currently does not endorse significant neuro-vegetative symptoms of depression, except for decreased sleep, possibly related to anxiety about upcoming surgery.  Current Medications -  Zoloft 75 mgrs QDAY  Trazodone 50 mgrs QHS PRN Insomnia- takes only occasionally  ROS- denies headache, SOB, cough, fever or chills, no GI symptoms, other than persistent mild anorexia without weight loss.  MSE- alert and attentive, well related, well groomed , good eye contact, speech is normal in rate And volume . States mood better, and presents euthymic, with reactive affect. Mild anxiety.  No thought disorder, no Suicidal ideations, no homicidal ideations, no hallucinations, no delusions, future oriented.  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 She is functioning well in daily activities, to include work. She is somewhat anxious today, as has elective surgery scheduled for tomorrow, but anxiety is mild. She is tolerating Zoloft well .  Plan-  Continue PHP- is benefiting from support, milieu, therapy. Family meeting is tentatively scheduled for next Monday. I have renewed medications as highlighted above via e-scripts . We have reviewed side effects.    Neita Garnet, MD

## 2014-09-21 NOTE — Progress Notes (Signed)
Adult Psychoeducational Group Note  Date:  09/21/2014 Time:  4:46 PM  Group Topic/Focus: Building Self Esteem:   The Focus of this group is helping patients become aware of the effects of self-esteem on their lives, the things they and others do that enhance or undermine their self-esteem, seeing the relationship between their level of self-esteem and the choices they make and learning ways to enhance self-esteem. Identifying Needs:   The focus of this group is to help patients identify their personal needs that have been historically problematic and identify healthy behaviors to address their needs.Self Esteem Action Plan:   The focus of this group is to help patients create a plan to continue to build self-esteem after discharge.  Participation Level:  Active  Participation Quality:  Attentive  Affect:  Appropriate  Cognitive:  Appropriate  Insight: Good  Engagement in Group:  Engaged  Modes of Intervention:  Activity, Discussion, Education, Exploration and Problem-solving  Additional Comments:  Patients were accessed for the quality and frequency of their social interactions. Pteints were asked to discuss their self- perceptions whether positive or negative.  Issues involving patients low self-esteem were addressed within the session. Patient behaviors were monitored for self-disparaging remarks and whether they maintained very little to no eye contact. The activity taught group members to verbalize positive feelings toward themselves. Each member was supported as they shared experiences from their own family-of-origin history that have caused feelings of low self-esteem and fear of verbal and emotional abuse. Patients were supported as they revealed experiences with critical and rejecting parents, peers and authority figures that led to feelings of low self-esteem. Many of the patients disclosed experiences of childhood verbally abusive language by parent figures; these have been noted to  lead to the fear of rejection in current relationships.  Kathleen [name] 09/21/2014, 4:46 PM

## 2014-09-22 ENCOUNTER — Encounter (HOSPITAL_COMMUNITY)
Admission: RE | Disposition: A | Discharge status: Home / Self Care | Payer: Self-pay | Source: Ambulatory Visit | Attending: General Surgery

## 2014-09-22 ENCOUNTER — Ambulatory Visit (HOSPITAL_COMMUNITY)
Admission: RE | Admit: 2014-09-22 | Discharge: 2014-09-22 | Disposition: A | Discharge status: Home / Self Care | Payer: Federal, State, Local not specified - PPO | Source: Ambulatory Visit | Attending: General Surgery | Admitting: General Surgery

## 2014-09-22 ENCOUNTER — Encounter (HOSPITAL_COMMUNITY): Payer: Self-pay | Admitting: *Deleted

## 2014-09-22 ENCOUNTER — Ambulatory Visit (HOSPITAL_COMMUNITY): Payer: Federal, State, Local not specified - PPO | Admitting: Anesthesiology

## 2014-09-22 ENCOUNTER — Other Ambulatory Visit (HOSPITAL_COMMUNITY): Payer: Self-pay

## 2014-09-22 ENCOUNTER — Ambulatory Visit (HOSPITAL_COMMUNITY): Payer: Federal, State, Local not specified - PPO | Admitting: Licensed Clinical Social Worker

## 2014-09-22 DIAGNOSIS — D241 Benign neoplasm of right breast: Secondary | ICD-10-CM | POA: Diagnosis not present

## 2014-09-22 DIAGNOSIS — F331 Major depressive disorder, recurrent, moderate: Secondary | ICD-10-CM

## 2014-09-22 DIAGNOSIS — Z79899 Other long term (current) drug therapy: Secondary | ICD-10-CM | POA: Diagnosis not present

## 2014-09-22 DIAGNOSIS — Z859 Personal history of malignant neoplasm, unspecified: Secondary | ICD-10-CM | POA: Insufficient documentation

## 2014-09-22 DIAGNOSIS — F419 Anxiety disorder, unspecified: Secondary | ICD-10-CM

## 2014-09-22 DIAGNOSIS — I10 Essential (primary) hypertension: Secondary | ICD-10-CM | POA: Diagnosis not present

## 2014-09-22 HISTORY — DX: Family history of other specified conditions: Z84.89

## 2014-09-22 HISTORY — DX: Adverse effect of unspecified anesthetic, initial encounter: T41.45XA

## 2014-09-22 HISTORY — PX: BREAST LUMPECTOMY: SHX2

## 2014-09-22 HISTORY — DX: Other complications of anesthesia, initial encounter: T88.59XA

## 2014-09-22 SURGERY — BREAST LUMPECTOMY
Anesthesia: General | Site: Breast | Laterality: Right

## 2014-09-22 MED ORDER — LACTATED RINGERS IV SOLN
INTRAVENOUS | Status: DC | PRN
  Administered 2014-09-22: 07:00:00 via INTRAVENOUS

## 2014-09-22 MED ORDER — FENTANYL CITRATE (PF) 250 MCG/5ML IJ SOLN
INTRAMUSCULAR | Status: AC
  Filled 2014-09-22: qty 5

## 2014-09-22 MED ORDER — PROMETHAZINE HCL 25 MG/ML IJ SOLN
6.2500 mg | INTRAMUSCULAR | Status: DC | PRN
Start: 2014-09-22 — End: 2014-09-22

## 2014-09-22 MED ORDER — HYDROMORPHONE HCL 1 MG/ML IJ SOLN: 0.2500 mg | INTRAMUSCULAR | Status: DC | PRN

## 2014-09-22 MED ORDER — ONDANSETRON HCL 4 MG/2ML IJ SOLN
INTRAMUSCULAR | Status: DC | PRN
  Administered 2014-09-22: 4 mg via INTRAVENOUS

## 2014-09-22 MED ORDER — CHLORHEXIDINE GLUCONATE 4 % EX LIQD: 1.0000 "application " | Freq: Once | CUTANEOUS | Status: DC

## 2014-09-22 MED ORDER — MIDAZOLAM HCL 5 MG/5ML IJ SOLN
INTRAMUSCULAR | Status: DC | PRN
  Administered 2014-09-22: 2 mg via INTRAVENOUS

## 2014-09-22 MED ORDER — SODIUM CHLORIDE 0.9 % IJ SOLN
INTRAMUSCULAR | Status: AC
  Filled 2014-09-22: qty 10

## 2014-09-22 MED ORDER — OXYCODONE-ACETAMINOPHEN 5-325 MG PO TABS
ORAL_TABLET | ORAL | Status: AC
  Administered 2014-09-22: 2 via ORAL
  Filled 2014-09-22: qty 2

## 2014-09-22 MED ORDER — BUPIVACAINE HCL (PF) 0.25 % IJ SOLN
INTRAMUSCULAR | Status: AC
  Filled 2014-09-22: qty 30

## 2014-09-22 MED ORDER — SUCCINYLCHOLINE CHLORIDE 20 MG/ML IJ SOLN
INTRAMUSCULAR | Status: AC
  Filled 2014-09-22: qty 1

## 2014-09-22 MED ORDER — PHENYLEPHRINE 40 MCG/ML (10ML) SYRINGE FOR IV PUSH (FOR BLOOD PRESSURE SUPPORT)
PREFILLED_SYRINGE | INTRAVENOUS | Status: AC
  Filled 2014-09-22: qty 10

## 2014-09-22 MED ORDER — MIDAZOLAM HCL 2 MG/2ML IJ SOLN
INTRAMUSCULAR | Status: AC
  Filled 2014-09-22: qty 2

## 2014-09-22 MED ORDER — MIDAZOLAM HCL 2 MG/2ML IJ SOLN: 0.5000 mg | Freq: Once | INTRAMUSCULAR | Status: DC | PRN

## 2014-09-22 MED ORDER — OXYCODONE-ACETAMINOPHEN 5-325 MG PO TABS
1.0000 | ORAL_TABLET | ORAL | Status: DC | PRN
  Administered 2014-09-22: 2 via ORAL

## 2014-09-22 MED ORDER — OXYCODONE-ACETAMINOPHEN 5-325 MG PO TABS: 1.0000 | ORAL_TABLET | ORAL | Status: AC | PRN

## 2014-09-22 MED ORDER — LIDOCAINE HCL (CARDIAC) 20 MG/ML IV SOLN
INTRAVENOUS | Status: DC | PRN
  Administered 2014-09-22: 20 mg via INTRAVENOUS

## 2014-09-22 MED ORDER — PROPOFOL 10 MG/ML IV BOLUS
INTRAVENOUS | Status: DC | PRN
  Administered 2014-09-22: 200 mg via INTRAVENOUS

## 2014-09-22 MED ORDER — ROCURONIUM BROMIDE 50 MG/5ML IV SOLN
INTRAVENOUS | Status: AC
  Filled 2014-09-22: qty 1

## 2014-09-22 MED ORDER — EPHEDRINE SULFATE 50 MG/ML IJ SOLN
INTRAMUSCULAR | Status: AC
  Filled 2014-09-22: qty 1

## 2014-09-22 MED ORDER — FENTANYL CITRATE (PF) 250 MCG/5ML IJ SOLN
INTRAMUSCULAR | Status: DC | PRN
  Administered 2014-09-22: 50 ug via INTRAVENOUS

## 2014-09-22 MED ORDER — PROPOFOL 10 MG/ML IV BOLUS
INTRAVENOUS | Status: AC
  Filled 2014-09-22: qty 20

## 2014-09-22 MED ORDER — MEPERIDINE HCL 25 MG/ML IJ SOLN: 6.2500 mg | INTRAMUSCULAR | Status: DC | PRN

## 2014-09-22 MED ORDER — LIDOCAINE HCL (CARDIAC) 20 MG/ML IV SOLN
INTRAVENOUS | Status: AC
  Filled 2014-09-22: qty 5

## 2014-09-22 MED ORDER — 0.9 % SODIUM CHLORIDE (POUR BTL) OPTIME
TOPICAL | Status: DC | PRN
  Administered 2014-09-22: 1000 mL

## 2014-09-22 MED ORDER — BUPIVACAINE HCL (PF) 0.25 % IJ SOLN
INTRAMUSCULAR | Status: DC | PRN
  Administered 2014-09-22: 14 mL

## 2014-09-22 SURGICAL SUPPLY — 45 items
BINDER BREAST LRG (GAUZE/BANDAGES/DRESSINGS) IMPLANT
BINDER BREAST XLRG (GAUZE/BANDAGES/DRESSINGS) IMPLANT
BLADE SURG 10 STRL SS (BLADE) ×2 IMPLANT
BLADE SURG 15 STRL LF DISP TIS (BLADE) ×1 IMPLANT
BLADE SURG 15 STRL SS (BLADE) ×2
CHLORAPREP W/TINT 26ML (MISCELLANEOUS) ×2 IMPLANT
CONT SPEC 4OZ CLIKSEAL STRL BL (MISCELLANEOUS) ×2 IMPLANT
COVER SURGICAL LIGHT HANDLE (MISCELLANEOUS) ×2 IMPLANT
DECANTER SPIKE VIAL GLASS SM (MISCELLANEOUS) ×2 IMPLANT
DEVICE DUBIN SPECIMEN MAMMOGRA (MISCELLANEOUS) IMPLANT
DRAPE LAPAROSCOPIC ABDOMINAL (DRAPES) ×2 IMPLANT
DRAPE UTILITY XL STRL (DRAPES) ×2 IMPLANT
ELECT CAUTERY BLADE 6.4 (BLADE) ×2 IMPLANT
ELECT COATED BLADE 2.86 ST (ELECTRODE) ×2 IMPLANT
ELECT REM PT RETURN 9FT ADLT (ELECTROSURGICAL) ×2
ELECTRODE REM PT RTRN 9FT ADLT (ELECTROSURGICAL) ×1 IMPLANT
GAUZE SPONGE 4X4 16PLY XRAY LF (GAUZE/BANDAGES/DRESSINGS) ×2 IMPLANT
GLOVE BIO SURGEON STRL SZ 6.5 (GLOVE) ×1 IMPLANT
GLOVE BIO SURGEON STRL SZ7 (GLOVE) ×2 IMPLANT
GLOVE BIO SURGEON STRL SZ7.5 (GLOVE) ×3 IMPLANT
GLOVE BIOGEL PI IND STRL 7.0 (GLOVE) IMPLANT
GLOVE BIOGEL PI IND STRL 7.5 (GLOVE) ×1 IMPLANT
GLOVE BIOGEL PI INDICATOR 7.0 (GLOVE) ×1
GLOVE BIOGEL PI INDICATOR 7.5 (GLOVE) ×1
GLOVE SS N UNI LF 7.0 STRL (GLOVE) ×2 IMPLANT
GOWN STRL REUS W/ TWL LRG LVL3 (GOWN DISPOSABLE) ×3 IMPLANT
GOWN STRL REUS W/TWL LRG LVL3 (GOWN DISPOSABLE) ×6
KIT BASIN OR (CUSTOM PROCEDURE TRAY) ×2 IMPLANT
KIT ROOM TURNOVER OR (KITS) ×2 IMPLANT
LIQUID BAND (GAUZE/BANDAGES/DRESSINGS) ×2 IMPLANT
NDL HYPO 25GX1X1/2 BEV (NEEDLE) ×1 IMPLANT
NEEDLE HYPO 25GX1X1/2 BEV (NEEDLE) ×2 IMPLANT
NS IRRIG 1000ML POUR BTL (IV SOLUTION) ×2 IMPLANT
PACK SURGICAL SETUP 50X90 (CUSTOM PROCEDURE TRAY) ×2 IMPLANT
PAD ARMBOARD 7.5X6 YLW CONV (MISCELLANEOUS) ×2 IMPLANT
PENCIL BUTTON HOLSTER BLD 10FT (ELECTRODE) ×2 IMPLANT
SPONGE LAP 4X18 X RAY DECT (DISPOSABLE) ×1 IMPLANT
SUT MON AB 3-0 SH 27 (SUTURE)
SUT MON AB 3-0 SH27 (SUTURE) IMPLANT
SUT MON AB 4-0 PC3 18 (SUTURE) ×2 IMPLANT
SUT VIC AB 3-0 SH 18 (SUTURE) ×2 IMPLANT
SYR BULB 3OZ (MISCELLANEOUS) ×2 IMPLANT
SYR CONTROL 10ML LL (SYRINGE) ×2 IMPLANT
TOWEL OR 17X24 6PK STRL BLUE (TOWEL DISPOSABLE) ×2 IMPLANT
TOWEL OR 17X26 10 PK STRL BLUE (TOWEL DISPOSABLE) ×2 IMPLANT

## 2014-09-22 NOTE — Progress Notes (Signed)
Adult Psychoeducational Group Note  Date:  09/21/2014 Time:  1:30pm  Group Topic/Focus: Conflict Resolution:   The focus of this group is to discuss the conflict resolution process and how it may be used upon discharge. Healthy Communication:   The focus of this group is to discuss communication, barriers to communication, as well as healthy ways to communicate with others. Overcoming Stress:   The focus of this group is to define stress and help patients assess their triggers.  Participation Level:  Minimal  Participation Quality:  Appropriate  Affect:  Appropriate  Cognitive:  Alert  Insight: Good  Engagement in Group:  Engaged  Modes of Intervention:  Activity, Confrontation, Discussion, Exploration and Role-play  Additional Comments:    Patients were encouraged to demonstrate awareness by processing experiences that have strengthened self-awareness, personal values, and appreciation of life. Patients were provided with mindfulness strategies from ACT. Therapist taught strategies to help decrease avoidance, disconnect thoughts from actions, acceptance of negative experiences rather than change or control symptoms and behave in accordance with broader life values. Therapist assessed for statements that were self-disparaging, indicated feelings of low self-esteem, lack self- confidence, and placed the patient in a situation in which they were vulnerable to depression. Therapist actively listed and helped patients to reframe negative perceptions of other treatment professionals. Therapist deescalated patient frustration by facilitating the "Empty Chair" technique. Patients were encouraged to express their frustrations and concerns to the empty chair as a representation of the staff member of whom the annoyances were directed.  Patients were able to process their feelings of frustration and work through to understanding the differences in communication styles as a theme. Therapist taught  patients different communication styles such as: assertive, non-assertive and aggressive and asked to identify their own style. Therapist gave feedback on the style each patient chose and assisted patients with comparing those styles to the most positive form of communication. Therapist assisted each patient with developing strategies to improve communication deficits.  Kathleen [name] 09/21/2014, 1:30pm

## 2014-09-22 NOTE — H&P (Signed)
Kathleen [name] 08/16/2014 10:17 AM Location: [facility] Patient #: [id number] DOB: [birth year] Single / Language: Cleophus Molt / Race: Black or African American Female  History of Present Illness Sammuel Hines. Marlou Starks MD; 08/16/2014 10:37 AM) Patient words: right breast.  The patient is a 19 year old female who presents with a breast mass. We are asked to see the patient in consultation by Dr. cousins to evaluate her for a right breast mass. The patient is a 19 year old black female who has noticed a mass in the upper inner right breast for more than a year. The mass has become painful for her. She states that it has gotten larger during the last year. The mass was biopsied a year ago and the pathology showed a fibroadenoma. She denies any discharge from the nipple. She has no immediate family history of breast cancer.   Other Problems Davy Pique Bynum, CMA; 08/16/2014 10:18 AM) Anxiety Disorder Back Pain Depression Lump In Breast Other disease, cancer, significant illness  Past Surgical History Marjean Donna, CMA; 08/16/2014 10:18 AM) No pertinent past surgical history  Diagnostic Studies History Marjean Donna, CMA; 08/16/2014 10:18 AM) Colonoscopy never Mammogram within last year Pap Smear 1-5 years ago  Allergies Marjean Donna, CMA; 08/16/2014 10:19 AM) Shellfish-derived Products  Medication History (Sonya Bynum, CMA; 08/16/2014 10:20 AM) EpiPen Jr (0.15 MG/0.3ML(1:2000) Device, Intramuscular) Active. Medications Reconciled  Social History Marjean Donna, CMA; 08/16/2014 10:18 AM) Caffeine use Carbonated beverages, Tea. No alcohol use No drug use Tobacco use Never smoker.  Family History Marjean Donna, [place]; 08/16/2014 10:18 AM) Alcohol Abuse Father. Anesthetic complications Mother. Arthritis Mother. Hypertension Father.  Pregnancy / Birth History Marjean Donna, [place]; 08/16/2014 10:18 AM) Age at menarche 81 years. Contraceptive History Oral contraceptives. Gravida  0 Para 0 Regular periods  Review of Systems ([place]; 08/16/2014 10:18 AM) General Not Present- Appetite Loss, Chills, Fatigue, Fever, Night Sweats, Weight Gain and Weight Loss. Skin Not Present- Change in Wart/Mole, Dryness, Hives, Jaundice, New Lesions, Non-Healing Wounds, Rash and Ulcer. HEENT Present- Seasonal Allergies, Sinus Pain and Sore Throat. Not Present- Earache, Hearing Loss, Hoarseness, Nose Bleed, Oral Ulcers, Ringing in the Ears, Visual Disturbances, Wears glasses/contact lenses and Yellow Eyes. Breast Present- Breast Mass and Breast Pain. Not Present- Nipple Discharge and Skin Changes. Cardiovascular Not Present- Chest Pain, Difficulty Breathing Lying Down, Leg Cramps, Palpitations, Rapid Heart Rate, Shortness of Breath and Swelling of Extremities. Gastrointestinal Not Present- Abdominal Pain, Bloating, Bloody Stool, Change in Bowel Habits, Chronic diarrhea, Constipation, Difficulty Swallowing, Excessive gas, Gets full quickly at meals, Hemorrhoids, Indigestion, Nausea, Rectal Pain and Vomiting. Female Genitourinary Not Present- Frequency, Nocturia, Painful Urination, Pelvic Pain and Urgency. Musculoskeletal Present- Back Pain and Joint Pain. Not Present- Joint Stiffness, Muscle Pain, Muscle Weakness and Swelling of Extremities. Neurological Present- Headaches. Not Present- Decreased Memory, Fainting, Numbness, Seizures, Tingling, Tremor, Trouble walking and Weakness. Psychiatric Present- Anxiety, Depression and Frequent crying. Not Present- Bipolar, Change in Sleep Pattern and Fearful. Endocrine Not Present- Cold Intolerance, Excessive Hunger, Hair Changes, Heat Intolerance, Hot flashes and New Diabetes. Hematology Not Present- Easy Bruising, Excessive bleeding, Gland problems, HIV and Persistent Infections.   Vitals (Sonya Bynum CMA; 08/16/2014 10:18 AM) 08/16/2014 10:18 AM Weight: 128 lb Height: 65in Body Surface Area: 1.63 m Body Mass Index: 21.3 kg/m Temp.:  98.23F(Temporal)  Pulse: 79 (Regular)  BP: 130/80 (Sitting, Left Arm, Standard)    Physical Exam Eddie Dibbles S. Marlou Starks MD; 08/16/2014 10:39 AM) General Mental Status-Alert. General Appearance-Consistent with stated age. Hydration-Well hydrated. Voice-Normal.  Head  and Neck Head-normocephalic, atraumatic with no lesions or palpable masses. Trachea-midline. Thyroid Gland Characteristics - normal size and consistency.  Eye Eyeball - Bilateral-Extraocular movements intact. Sclera/Conjunctiva - Bilateral-No scleral icterus.  Chest and Lung Exam Chest and lung exam reveals -quiet, even and easy respiratory effort with no use of accessory muscles and on auscultation, normal breath sounds, no adventitious sounds and normal vocal resonance. Inspection Chest Wall - Normal. Back - normal.  Breast Note: She has symmetric dense breast tissue bilaterally. There is a discrete round mobile palpable mass in the upper inner right breast near the areolar. There is no palpable mass in the left breast. There is no palpable axillary, supraclavicular, or cervical lymphadenopathy. The mass measures about 2 cm.   Cardiovascular Cardiovascular examination reveals -normal heart sounds, regular rate and rhythm with no murmurs and normal pedal pulses bilaterally.  Abdomen Inspection Inspection of the abdomen reveals - No Hernias. Skin - Scar - no surgical scars. Palpation/Percussion Palpation and Percussion of the abdomen reveal - Soft, Non Tender, No Rebound tenderness, No Rigidity (guarding) and No hepatosplenomegaly. Auscultation Auscultation of the abdomen reveals - Bowel sounds normal.  Neurologic Neurologic evaluation reveals -alert and oriented x 3 with no impairment of recent or remote memory. Mental Status-Normal.  Musculoskeletal Normal Exam - Left-Upper Extremity Strength Normal and Lower Extremity Strength Normal. Normal Exam - Right-Upper Extremity Strength  Normal and Lower Extremity Strength Normal.  Lymphatic Head & Neck  General Head & Neck Lymphatics: Bilateral - Description - Normal. Axillary  General Axillary Region: Bilateral - Description - Normal. Tenderness - Non Tender. Femoral & Inguinal  Generalized Femoral & Inguinal Lymphatics: Bilateral - Description - Normal. Tenderness - Non Tender.    Assessment & Plan Eddie Dibbles S. 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 Because of this I think it would be reasonable to remove the fibroadenoma. She would also like to have this done. I have discussed with her in detail the risks and benefits of the operation to remove this as well as some of the technical aspects and she understands and wishes to proceed. I'll plan for right breast lumpectomy     Signed by Luella Cook, MD (08/16/2014 10:41 AM)

## 2014-09-22 NOTE — Anesthesia Procedure Notes (Signed)
Procedure Name: LMA Insertion Date/Time: 09/22/2014 7:33 AM Performed by: Julian Reil Pre-anesthesia Checklist: Patient identified, Emergency Drugs available, Suction available and Patient being monitored Patient Re-evaluated:Patient Re-evaluated prior to inductionOxygen Delivery Method: Circle system utilized Preoxygenation: Pre-oxygenation with 100% oxygen Intubation Type: IV induction Ventilation: Mask ventilation without difficulty LMA: LMA inserted LMA Size: 4.0 Tube type: Oral Number of attempts: 1 Placement Confirmation: positive ETCO2 and breath sounds checked- equal and bilateral Tube secured with: Tape Dental Injury: Teeth and Oropharynx as per pre-operative assessment

## 2014-09-22 NOTE — Anesthesia Postprocedure Evaluation (Signed)
  Anesthesia Post-op Note  Patient: Kathleen [name]  Procedure(s) Performed: Procedure(s): RIGHT BREAST LUMPECTOMY (Right)  Patient Location: PACU  Anesthesia Type:General  Level of Consciousness: awake, alert , oriented and patient cooperative  Airway and Oxygen Therapy: Patient Spontanous Breathing  Post-op Pain: none  Post-op Assessment: Post-op Vital signs reviewed, Patient's Cardiovascular Status Stable, Respiratory Function Stable, Patent Airway, No signs of Nausea or vomiting and Pain level controlled              Post-op Vital Signs: Reviewed and stable  Last Vitals:  Filed Vitals:   09/22/14 0915  BP: 109/69  Pulse:   Temp: 36.6 C  Resp: 18    Complications: No apparent anesthesia complications

## 2014-09-22 NOTE — Interval H&P Note (Signed)
History and Physical Interval Note:  09/22/2014 7:08 AM  Delanna Notice  has presented today for surgery, with the diagnosis of Right Breast Fibroadenoma  The various methods of treatment have been discussed with the patient and family. After consideration of risks, benefits and other options for treatment, the patient has consented to  Procedure(s): RIGHT BREAST LUMPECTOMY (Right) as a surgical intervention .  The patient's history has been reviewed, patient examined, no change in status, stable for surgery.  I have reviewed the patient's chart and labs.  Questions were answered to the patient's satisfaction.     TOTH III,Darriel Sinquefield S

## 2014-09-22 NOTE — Op Note (Signed)
09/22/2014  8:28 AM  PATIENT:  Delanna Notice  19 y.o. female  PRE-OPERATIVE DIAGNOSIS:  Right Breast Fibroadenoma  POST-OPERATIVE DIAGNOSIS:  Right Breast Fibroadenoma  PROCEDURE:  Procedure(s): RIGHT BREAST LUMPECTOMY (Right)  SURGEON:  Surgeon(s) and Role:    * Jovita Kussmaul, MD - Primary  PHYSICIAN ASSISTANT:   ASSISTANTS: none   ANESTHESIA:   general  EBL:  Total I/O In: 600 [I.V.:600] Out: 10 [Blood:10]  BLOOD ADMINISTERED:none  DRAINS: none   LOCAL MEDICATIONS USED:  MARCAINE     SPECIMEN:  Source of Specimen:  right breast mass  DISPOSITION OF SPECIMEN:  PATHOLOGY  COUNTS:  YES  TOURNIQUET:  * No tourniquets in log *  DICTATION: .Dragon Dictation  After informed consent was obtained the patient was brought to the operating room and placed in the supine position on the operating room table. After adequate induction of general anesthesia the patient's right breast was prepped with ChloraPrep, allowed to dry, and draped in usual sterile manner. The palpable mass was at the edge of the areola in the upper inner quadrant. A curvilinear incision was made overlying the mass at the edge of the areola with a 15 blade knife. This incision was carried through the skin and subcutaneous tissue sharply with electrocautery. Once into the breast tissue the mass was excised sharply with the electrocautery. Once the mass was removed it was sent to pathology for further evaluation. Hemostasis was achieved using the Bovie electrocautery. The wound was then infiltrated with quarter percent Marcaine. The deep layer of the wound was closed with interrupted 3-0 Vicryl stitches. The skin was then closed with interrupted 4-0 Monocryl subcuticular stitches. Dermabond dressings were applied. The patient tolerated the procedure well. At the end of the case all needle sponge and counts were correct. The patient was then awakened and taken to recovery in stable condition.  PLAN OF CARE:  Discharge to home after PACU  PATIENT DISPOSITION:  PACU - hemodynamically stable.   Delay start of Pharmacological VTE agent (>24hrs) due to surgical blood loss or risk of bleeding: not applicable

## 2014-09-22 NOTE — Anesthesia Preprocedure Evaluation (Signed)
Anesthesia Evaluation  Patient identified by MRN, date of birth, ID band Patient awake    Reviewed: Allergy & Precautions, NPO status , Patient's Chart, lab work & pertinent test results  History of Anesthesia Complications (+) Emergence Delirium and history of anesthetic complications  Airway Mallampati: II  TM Distance: >3 FB Neck ROM: Full    Dental  (+) Dental Advisory Given   Pulmonary neg pulmonary ROS,  breath sounds clear to auscultation        Cardiovascular hypertension (last metoprolol taken a few weeks ago for transient, situational hypertension ), Rhythm:Regular Rate:Normal     Neuro/Psych Anxiety Depression negative neurological ROS     GI/Hepatic negative GI ROS, Neg liver ROS,   Endo/Other  negative endocrine ROS  Renal/GU negative Renal ROS     Musculoskeletal   Abdominal   Peds  Hematology negative hematology ROS (+)   Anesthesia Other Findings   Reproductive/Obstetrics 09/14/14 preg test: NEG                             Anesthesia Physical Anesthesia Plan  ASA: II  Anesthesia Plan: General   Post-op Pain Management:    Induction: Intravenous  Airway Management Planned: LMA  Additional Equipment:   Intra-op Plan:   Post-operative Plan:   Informed Consent: I have reviewed the patients History and Physical, chart, labs and discussed the procedure including the risks, benefits and alternatives for the proposed anesthesia with the patient or authorized representative who has indicated his/her understanding and acceptance.   Dental advisory given  Plan Discussed with: CRNA and Surgeon  Anesthesia Plan Comments: (Plan routine monitors, GA- LMA OK)        Anesthesia Quick Evaluation

## 2014-09-22 NOTE — Transfer of Care (Signed)
Immediate Anesthesia Transfer of Care Note  Patient: Kathleen [name]  Procedure(s) Performed: Procedure(s): RIGHT BREAST LUMPECTOMY (Right)  Patient Location: PACU  Anesthesia Type:General  Level of Consciousness: sedated and responds to stimulation  Airway & Oxygen Therapy: Patient Spontanous Breathing and Patient connected to nasal cannula oxygen  Post-op Assessment: Report given to RN, Post -op Vital signs reviewed and stable and Patient moving all extremities  Post vital signs: Reviewed and stable  Last Vitals:  Filed Vitals:   09/22/14 0600  BP: 108/69  Pulse: 76  Temp: 36.8 C  Resp: 18    Complications: No apparent anesthesia complications

## 2014-09-22 NOTE — Progress Notes (Addendum)
Date: 09/21/2014 Time:4:15 PM Group Focus: Communication Skills Purpose: Addressing issues of feeling solitary vs. having a sense of community. Intervention: Asked her to relate negative aspects of aloneness through possible social scenarios. She stated that she can be self-sufficient when others are not around. When others are present in her environment, she says that she has a desire to belong in the community. Effect: She concluded that she is comfortable in such and "all-or-nothing" duality, yet sees the need to develop a larger support system.  Doreene Adas, CPSS

## 2014-09-23 ENCOUNTER — Encounter (HOSPITAL_COMMUNITY): Payer: Self-pay | Admitting: General Surgery

## 2014-09-27 ENCOUNTER — Other Ambulatory Visit (HOSPITAL_COMMUNITY): Payer: Federal, State, Local not specified - PPO

## 2014-09-27 ENCOUNTER — Ambulatory Visit (HOSPITAL_COMMUNITY): Payer: Self-pay | Admitting: Psychiatry

## 2014-09-27 ENCOUNTER — Ambulatory Visit (HOSPITAL_COMMUNITY): Payer: Self-pay | Admitting: Licensed Clinical Social Worker

## 2014-09-27 DIAGNOSIS — F332 Major depressive disorder, recurrent severe without psychotic features: Secondary | ICD-10-CM

## 2014-09-27 DIAGNOSIS — F419 Anxiety disorder, unspecified: Secondary | ICD-10-CM

## 2014-09-27 DIAGNOSIS — F331 Major depressive disorder, recurrent, moderate: Secondary | ICD-10-CM

## 2014-09-27 NOTE — Progress Notes (Signed)
Adult Psychoeducational Group Note  Date:  09/27/2014 Time:  2:00 PM  Group Topic/Focus: Building Self Esteem:   The Focus of this group is helping patients become aware of the effects of self-esteem on their lives, the things they and others do that enhance or undermine their self-esteem, seeing the relationship between their level of self-esteem and the choices they make and learning ways to enhance self-esteem. Healthy Communication:   The focus of this group is to discuss communication, barriers to communication, as well as healthy ways to communicate with others. Overcoming Stress:   The focus of this group is to define stress and help patients assess their triggers.  Participation Level:  Active  Participation Quality:  Appropriate  Affect:  Anxious  Cognitive:  Appropriate  Insight: Good  Engagement in Group:  Engaged  Modes of Intervention:  Activity, Education, Problem-solving and Role-play  Additional Comments:  Group members were asked to write, star in and deliver a commercial segment at least 2 minutes long selling their product (themselves). Many of the group members expressed feeling a sense of anxiety. Therapist guided the through a deep breathing exercise to reduce anxiety symptoms. Therapist completed activity in which each member was able to identify cognitive distortions in thinking about performing the exercise in front of others. Member were asked to discuss and process their anxiety triggers with the therapist. Each member was asked to rate their anxiety on a scale from 1-10 before and after completing the exercise. Each member was able to complete the exercise and most reported anxiety reductions from 7 to 4.   Kathleen [name] 09/27/2014, 2:00 PM       Adult Psychoeducational Group Note  Date:  09/27/14 Time:  5:00pm  Group Topic/Focus: Building Self Esteem:   The Focus of this group is helping patients become aware of the effects of self-esteem on their  lives, the things they and others do that enhance or undermine their self-esteem, seeing the relationship between their level of self-esteem and the choices they make and learning ways to enhance self-esteem. Healthy Communication:   The focus of this group is to discuss communication, barriers to communication, as well as healthy ways to communicate with others. Recovery Goals:   The focus of this group is to identify appropriate goals for recovery and establish a plan to achieve them.  Participation Level:  Active  Participation Quality:  Appropriate  Affect:  Appropriate  Cognitive:  Alert  Insight: Good  Engagement in Group:  Developing/Improving and Engaged  Modes of Intervention:  Activity, Clarification, Discussion, Problem-solving and Role-play  Additional Comments:  Group members were asked to explore their feelings about getting closer and developing a better relationship with parents. Group members were asked to discuss the pros and cons of a more cohesive family from each member's perspective. Distorted and negative perceptions were processed and feedback was offered.  Members were encouraged to share insight into their family dynamics and were assisted with identifying underlying feelings and emotions which have led to negative behaviors in the past. Feelings of fear, anger, hurt, or depression that may contribute to family tension and/or conflict were explored. Potential pitfalls that may contribute to backsliding into poor communication and disengagement were monitored and discussed. Brainstorming possible causes of family member to slide back into old habits of ineffective communication (belittling, negative comments, labeling, etc.) were discussed and conflict resolution strategies were developed.  Kathleen [name] 09/27/14, 5:00pm

## 2014-09-27 NOTE — Progress Notes (Unsigned)
September 27, 2014 4:25 PM  Purpose: To "reframe" negatively viewed personality traits in efforts to effectively address issues with low self-esteem. Intervention: Asked her to relate how her perceived negative trait or "deficit" (sensitivity) could in some way be viewed as an "asset", or positive personality trait. Effect: She concluded that while bothered by her idea that she is too sensitive, through reframing she could actually view this personality trait as her possessing an attentive, caring personality.

## 2014-09-28 ENCOUNTER — Other Ambulatory Visit (HOSPITAL_COMMUNITY): Payer: Federal, State, Local not specified - PPO

## 2014-09-29 ENCOUNTER — Encounter (HOSPITAL_COMMUNITY): Payer: Self-pay | Admitting: Licensed Clinical Social Worker

## 2014-09-29 ENCOUNTER — Other Ambulatory Visit (HOSPITAL_COMMUNITY): Payer: Self-pay

## 2014-09-29 ENCOUNTER — Telehealth (HOSPITAL_COMMUNITY): Payer: Self-pay | Admitting: Licensed Clinical Social Worker

## 2014-09-30 ENCOUNTER — Other Ambulatory Visit (HOSPITAL_COMMUNITY): Payer: Federal, State, Local not specified - PPO | Admitting: Licensed Clinical Social Worker

## 2014-09-30 ENCOUNTER — Ambulatory Visit (HOSPITAL_COMMUNITY): Payer: Self-pay | Admitting: Licensed Clinical Social Worker

## 2014-09-30 VITALS — BP 106/72 | HR 86 | Ht 65.0 in | Wt 123.8 lb

## 2014-09-30 DIAGNOSIS — F419 Anxiety disorder, unspecified: Secondary | ICD-10-CM

## 2014-09-30 DIAGNOSIS — F322 Major depressive disorder, single episode, severe without psychotic features: Secondary | ICD-10-CM | POA: Diagnosis not present

## 2014-09-30 DIAGNOSIS — F332 Major depressive disorder, recurrent severe without psychotic features: Secondary | ICD-10-CM

## 2014-09-30 DIAGNOSIS — F331 Major depressive disorder, recurrent, moderate: Secondary | ICD-10-CM

## 2014-09-30 NOTE — Psych (Signed)
[facility] PHP THERAPIST PROGRESS NOTE  [name] [id number]    Date: 09/30/14 Time: 11:00 AM-12:30 PM   Group Topic/Focus:  Discussion of handout "Accepting Anxiety"  Participation Level:  Active  Participation Quality:  Sharing  Affect:  Defensive  Cognitive:  Alert  Insight: Limited  Engagement in Group:  Engaged  Modes of Intervention:  Activity, Discussion, Education, Exploration and Skills to manage anxiety  Additional Comments: Group topic related to discussion from handout "Accepting Anxiety". Group started with a check in and an ice breaker. All group members relate to social anxiety and recognize the cognitive distortions they have that occur that lead to anxiety. The group discussed how they find it hard to implement effective strategies when they are in the panic mindset situation and they all felt that they want to escape and recognize that this is not a helpful strategy. All group members felt that what they learned from handout is that they have to fight the urge to avoid and escape. Therapist pointed out that strategies such as avoiding help to maintain the anxiety and make it worse while learning to confront help [place] to develop skills and to learn we can manage anxiety. One group member taught the group his deep breathing exercise that he said helps. One group member shared that he realizes anxiety is the misperception of danger and he said that his objective is to find ways to change heart rate and blood flow to get the body out of the fight or flight mood. Patient said that she would work on not avoiding situations. She participated by sharing and listening appropriately. It seemed that something was bothering but she chose not to share in the group. Therapist encouraged her to talk privately if she needed to talk through what was bothering her.  Suicidal/Homicidal: no  Plan: 1.Therapist apply supportive interventions to help patient work through negative  feelings.2.Patient apply effective strategies to manage anxiety.    [facility] PHP THERAPIST GROUP NOTE  Date: 09/30/14 Time: 1:00 PM-2:00 PM  Group Topic/Focus:  Group topic related to discussion of video by Ellwood Handler titled "How to Completely Lose Social Anxiety"  Participation Level:  Active  Participation Quality:  Appropriate and Sharing  Affect:  Appropriate  Cognitive:  Appropriate  Insight: Limited  Engagement in Group:  Engaged  Modes of Intervention:  Activity, Discussion, Education, Exploration and Skills to manage social anxiety  Additional Comments: Group topic related to discussion of video by Ellwood Handler titled "How to Completely Lose Social Anxiety". The video discussed how a self-image is a story in our head of who we think we are but also that we we really are is outside of the story and who we are in the moment to moment that does not change. The group felt that the message was that they could create a positive story, let themselves be open to positive messages and at the same time be aware that who they are is what exists outside of the story, that exists moment to moment and does not change. The discussion can help patients in changing and challenging negative internal scripts about themselves. Patient asked how she can believe the positive self-talk. There was good feedback that finding a good support group would be helpful in reinforcing the positive beliefs about self. Patient acknowledges that this is something she has been working but had not made significant progress in treatment. Patient had been drawing and reported that mood was improved since this morning.  Suicidal/Homicidal: no  Plan: 1.Patient work on negative self-talk and developing a support network.2.Therapist continue to help patient identify and challenge negative self-talk and cognitive distortions.   Diagnosis: Primary Diagnosis: Major depressive disorder, recurrent, severe without psychotic  features [F33.2]    1. Major depressive disorder, recurrent, severe without psychotic features   2. Anxiety disorder, unspecified anxiety disorder type       Kathleen [name] A 09/30/2014

## 2014-09-30 NOTE — Progress Notes (Signed)
Adult Psychoeducational Group Note  Date:  09/30/2014 Time:  4:56 PM  Group Topic/Focus: Building Self Esteem:   The Focus of this group is helping patients become aware of the effects of self-esteem on their lives, the things they and others do that enhance or undermine their self-esteem, seeing the relationship between their level of self-esteem and the choices they make and learning ways to enhance self-esteem.  Participation Level:  Active  Participation Quality:  Appropriate  Affect:  Appropriate  Cognitive:  Appropriate  Insight: Appropriate  Engagement in Group:  Developing/Improving  Modes of Intervention:  Activity, Discussion, Education, Exploration, Socialization and Support  Additional Comments:  Group members were shown a  4 minute clip about a self-esteem related topic. Each member was asked to identify a celebrity or business which has stated a claim that everyone has come to believe. Members were asked to recall statements they'd made about themselves that may have currently shaped or defined their attitudes or thinking patterns. Each member was challenged to create one statement about themselves that they will repeat often and convince others to believe about themselves as well.  Therapist gave feedback and reinforced positive statements shared by each member.  Group members were assigned the task of creating an "About Me" booklet.  Members were to enter fun facts about themselves. Each member was instructed to write a positive comment on each of the other member's booklets. Comments and fun facts were read aloud and discussed. The purpose of this exercise was to facilitate socialization skills and increase self-awareness and build self-esteem.   Kathleen [name] 09/30/2014, 4:56 PM
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
## 2014-09-30 NOTE — Progress Notes (Signed)
D. Patient presented with appropriate affect, level mood and denied any suicidal or homicidal ideations. No auditory or visual hallucinations and denied any problems with appetite or sleep.  A. Patient rated her depression a 0, hopelessness a 0 and anxiety a 1 on a scale of 0-10 with 0 being none at all and 10 being the highest.  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 And helping to watch her 3 nieces and nephews visit for the summer.  Patient scored a 0 on the PHQ2.  R. Patient stable today with no complaints and states she is going to try to attend PHP more as helps with her anxiety.  Patient stated doing well currently on present medications and reviewed medications as states no side effects or problems with current regimen.

## 2014-10-03 ENCOUNTER — Other Ambulatory Visit (HOSPITAL_COMMUNITY): Payer: Self-pay

## 2014-10-04 ENCOUNTER — Other Ambulatory Visit (HOSPITAL_COMMUNITY): Payer: Self-pay

## 2014-10-05 ENCOUNTER — Telehealth (HOSPITAL_COMMUNITY): Payer: Self-pay | Admitting: Licensed Clinical Social Worker

## 2014-10-05 ENCOUNTER — Other Ambulatory Visit (HOSPITAL_COMMUNITY): Payer: Self-pay

## 2014-10-06 ENCOUNTER — Other Ambulatory Visit (HOSPITAL_COMMUNITY): Payer: Self-pay

## 2014-10-07 ENCOUNTER — Ambulatory Visit (HOSPITAL_COMMUNITY): Payer: Self-pay | Admitting: Licensed Clinical Social Worker
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
## 2014-10-10 ENCOUNTER — Other Ambulatory Visit (HOSPITAL_COMMUNITY): Payer: Self-pay

## 2014-10-11 ENCOUNTER — Telehealth (HOSPITAL_COMMUNITY): Payer: Self-pay | Admitting: Licensed Clinical Social Worker

## 2014-10-11 ENCOUNTER — Encounter (HOSPITAL_COMMUNITY): Payer: Self-pay | Admitting: Licensed Clinical Social Worker

## 2014-10-11 ENCOUNTER — Other Ambulatory Visit (HOSPITAL_COMMUNITY): Payer: Self-pay

## 2014-10-12 ENCOUNTER — Other Ambulatory Visit (HOSPITAL_COMMUNITY): Payer: Federal, State, Local not specified - PPO

## 2014-10-13 ENCOUNTER — Other Ambulatory Visit (HOSPITAL_COMMUNITY): Payer: Self-pay

## 2014-10-14 ENCOUNTER — Other Ambulatory Visit (HOSPITAL_COMMUNITY): Payer: Self-pay

## 2014-10-18 ENCOUNTER — Other Ambulatory Visit (HOSPITAL_COMMUNITY): Payer: Self-pay

## 2014-10-20 ENCOUNTER — Other Ambulatory Visit (HOSPITAL_COMMUNITY): Payer: Self-pay

## 2014-10-21 ENCOUNTER — Other Ambulatory Visit (HOSPITAL_COMMUNITY): Payer: Self-pay

## 2014-10-24 ENCOUNTER — Other Ambulatory Visit (HOSPITAL_COMMUNITY): Payer: Self-pay

## 2014-10-25 ENCOUNTER — Other Ambulatory Visit (HOSPITAL_COMMUNITY): Payer: Self-pay

## 2014-11-04 ENCOUNTER — Encounter (HOSPITAL_COMMUNITY): Payer: Self-pay | Admitting: Licensed Clinical Social Worker

## 2014-11-30 ENCOUNTER — Telehealth (HOSPITAL_COMMUNITY): Payer: Self-pay | Admitting: Licensed Clinical Social Worker

## 2014-12-01 ENCOUNTER — Ambulatory Visit (HOSPITAL_COMMUNITY): Payer: Self-pay | Admitting: Psychiatry

## 2014-12-21 NOTE — Telephone Encounter (Signed)
[id number]

## 2015-07-27 ENCOUNTER — Other Ambulatory Visit: Payer: Self-pay | Admitting: Obstetrics and Gynecology

## 2015-07-27 DIAGNOSIS — N6310 Unspecified lump in the right breast, unspecified quadrant: Secondary | ICD-10-CM

## 2015-07-31 ENCOUNTER — Other Ambulatory Visit: Payer: Self-pay

## 2015-10-04 ENCOUNTER — Other Ambulatory Visit: Payer: Self-pay

## 2015-10-10 ENCOUNTER — Ambulatory Visit
Admission: RE | Admit: 2015-10-10 | Discharge: 2015-10-10 | Disposition: A | Discharge status: Home / Self Care | Payer: Federal, State, Local not specified - PPO | Source: Ambulatory Visit | Attending: Obstetrics and Gynecology | Admitting: Obstetrics and Gynecology

## 2015-10-10 DIAGNOSIS — N6310 Unspecified lump in the right breast, unspecified quadrant: Secondary | ICD-10-CM

## 2016-08-03 IMAGING — CR DG CHEST 2V
2 series · 2 of 2 positions shown · non-contrast
Comparison: None.

CLINICAL DATA: Cough, shortness of Breath, fever

EXAM:
CHEST  2 VIEW

[w chest pa]
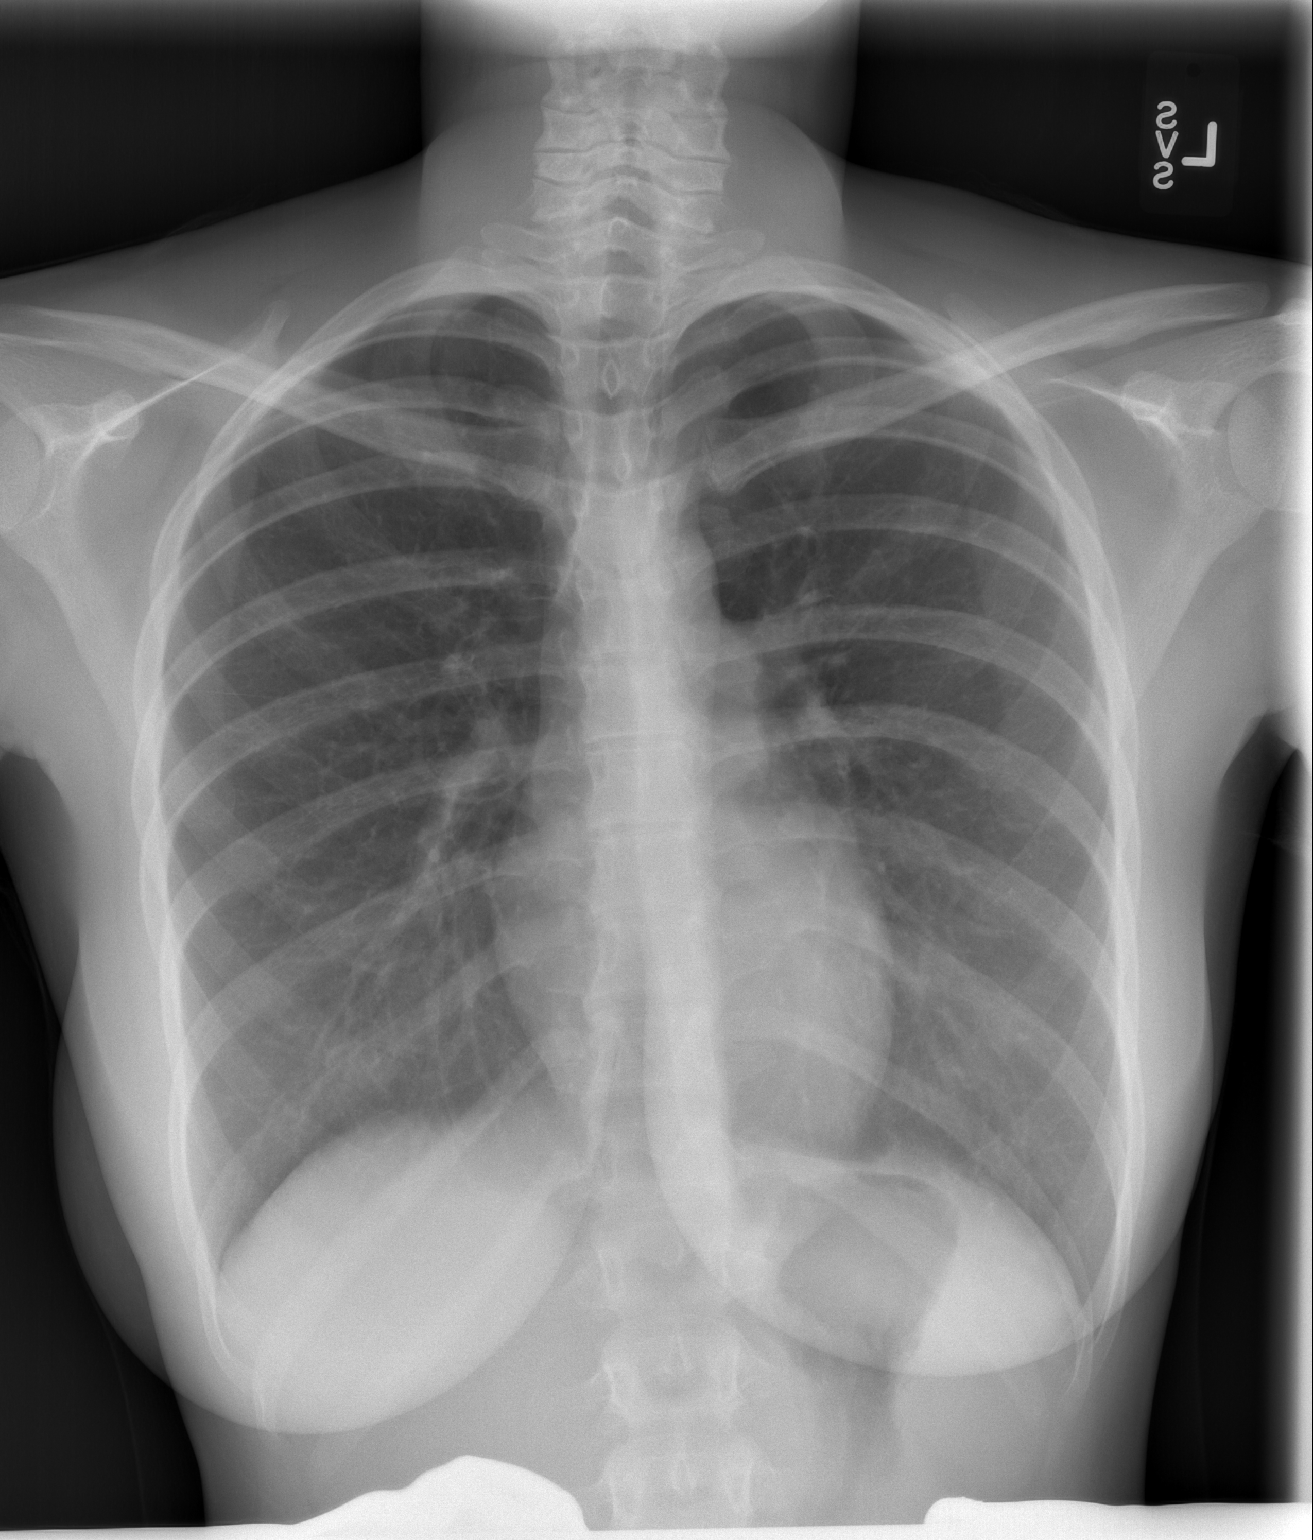

[w chest lat]
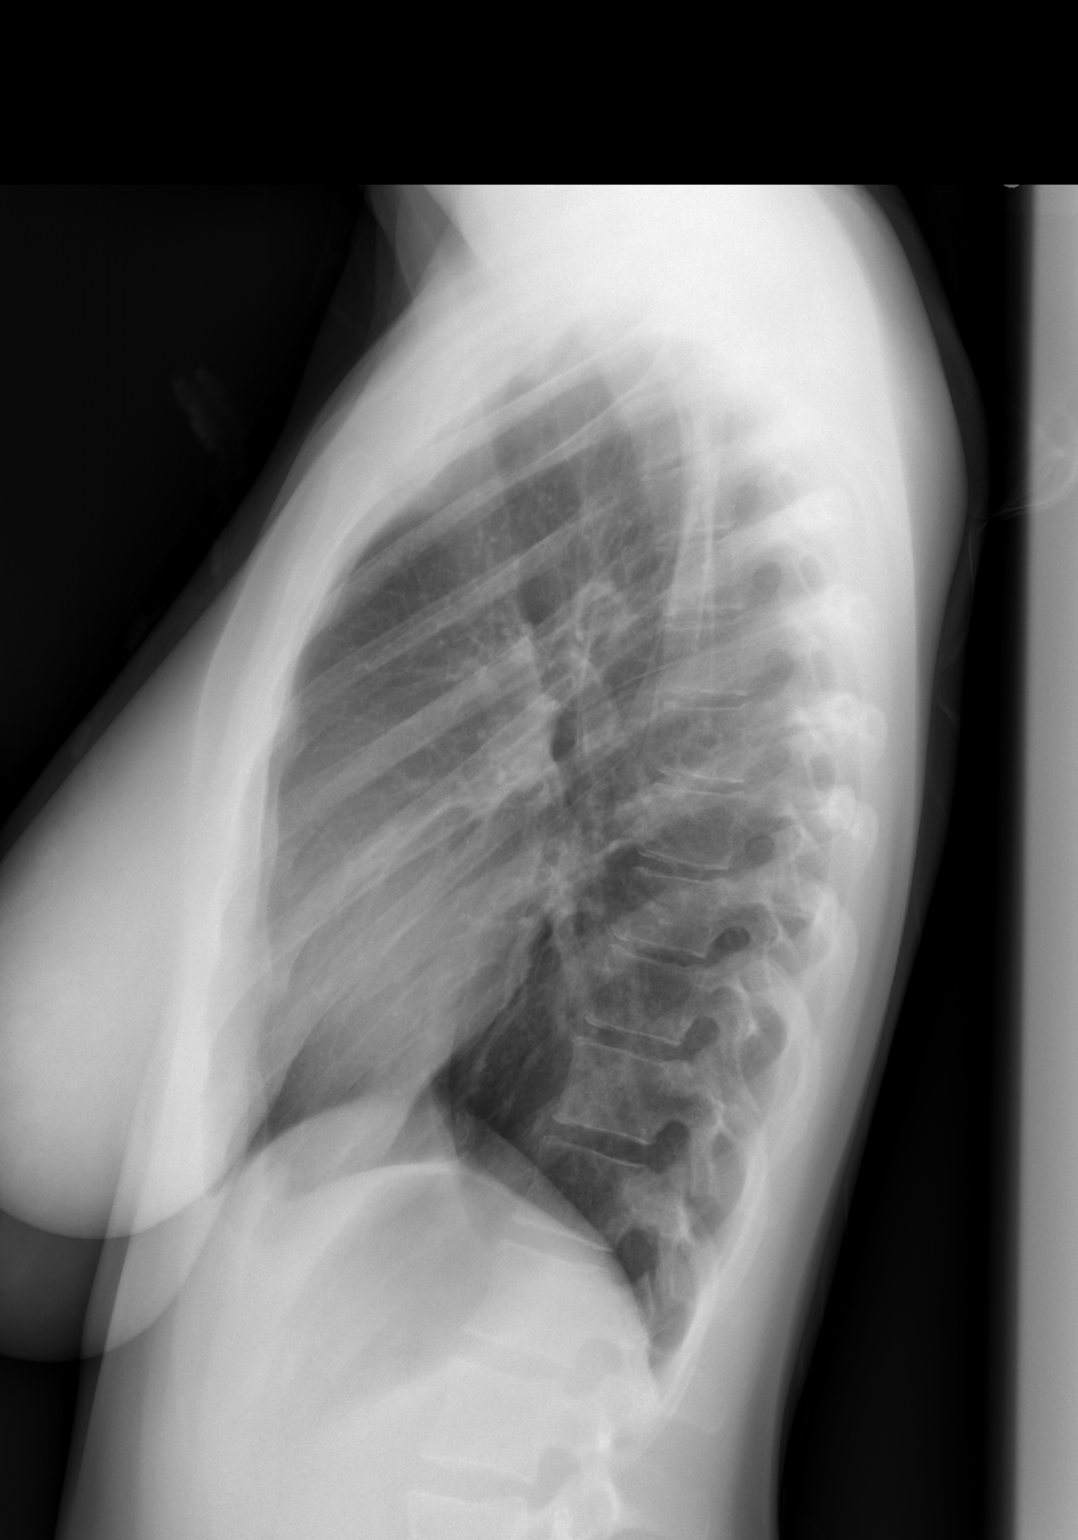

[2 of 2 positions shown; findings below may reference images not displayed]

FINDINGS: Cardiomediastinal silhouette is unremarkable. No acute infiltrate or
pleural effusion. No pulmonary edema. Bony thorax is unremarkable.
IMPRESSION: No active cardiopulmonary disease.

## 2017-06-10 DIAGNOSIS — Z681 Body mass index (BMI) 19 or less, adult: Secondary | ICD-10-CM | POA: Diagnosis not present

## 2017-06-10 DIAGNOSIS — Z1159 Encounter for screening for other viral diseases: Secondary | ICD-10-CM | POA: Diagnosis not present

## 2017-06-10 DIAGNOSIS — Z113 Encounter for screening for infections with a predominantly sexual mode of transmission: Secondary | ICD-10-CM | POA: Diagnosis not present

## 2017-06-10 DIAGNOSIS — N898 Other specified noninflammatory disorders of vagina: Secondary | ICD-10-CM | POA: Diagnosis not present

## 2017-06-10 DIAGNOSIS — Z118 Encounter for screening for other infectious and parasitic diseases: Secondary | ICD-10-CM | POA: Diagnosis not present

## 2017-06-10 DIAGNOSIS — Z01419 Encounter for gynecological examination (general) (routine) without abnormal findings: Secondary | ICD-10-CM | POA: Diagnosis not present

## 2017-06-10 DIAGNOSIS — Z114 Encounter for screening for human immunodeficiency virus [HIV]: Secondary | ICD-10-CM | POA: Diagnosis not present

## 2017-06-25 DIAGNOSIS — Z30431 Encounter for routine checking of intrauterine contraceptive device: Secondary | ICD-10-CM | POA: Diagnosis not present

## 2017-06-25 DIAGNOSIS — N944 Primary dysmenorrhea: Secondary | ICD-10-CM | POA: Diagnosis not present

## 2019-02-05 DIAGNOSIS — Z20828 Contact with and (suspected) exposure to other viral communicable diseases: Secondary | ICD-10-CM | POA: Diagnosis not present

## 2019-06-27 DIAGNOSIS — F329 Major depressive disorder, single episode, unspecified: Secondary | ICD-10-CM | POA: Diagnosis not present

## 2019-06-27 DIAGNOSIS — F419 Anxiety disorder, unspecified: Secondary | ICD-10-CM | POA: Diagnosis not present

## 2019-06-27 DIAGNOSIS — J351 Hypertrophy of tonsils: Secondary | ICD-10-CM | POA: Diagnosis not present

## 2019-06-27 DIAGNOSIS — J029 Acute pharyngitis, unspecified: Secondary | ICD-10-CM | POA: Diagnosis not present

## 2019-06-27 DIAGNOSIS — Z789 Other specified health status: Secondary | ICD-10-CM | POA: Diagnosis not present

## 2019-07-24 DIAGNOSIS — R5383 Other fatigue: Secondary | ICD-10-CM | POA: Diagnosis not present

## 2019-07-24 DIAGNOSIS — F331 Major depressive disorder, recurrent, moderate: Secondary | ICD-10-CM | POA: Diagnosis not present

## 2019-07-24 DIAGNOSIS — R4681 Obsessive-compulsive behavior: Secondary | ICD-10-CM | POA: Diagnosis not present

## 2019-07-24 DIAGNOSIS — E559 Vitamin D deficiency, unspecified: Secondary | ICD-10-CM | POA: Diagnosis not present

## 2019-07-24 DIAGNOSIS — Z79899 Other long term (current) drug therapy: Secondary | ICD-10-CM | POA: Diagnosis not present
